# Patient Record
Sex: Female | Born: 1940
Health system: Southern US, Community
[De-identification: ages and names within clinical notes are randomized; demographics above are authoritative.]

## PROBLEM LIST (undated history)

## (undated) DIAGNOSIS — M1990 Unspecified osteoarthritis, unspecified site: Secondary | ICD-10-CM

## (undated) HISTORY — DX: Unspecified osteoarthritis, unspecified site: M19.90

---

## 1975-12-03 HISTORY — PX: TUBAL LIGATION: SHX77

## 1998-12-02 HISTORY — PX: HEMORRHOID SURGERY: SHX153

## 1998-12-02 HISTORY — PX: HERNIA REPAIR: SHX51

## 2002-06-02 ENCOUNTER — Ambulatory Visit (HOSPITAL_COMMUNITY): Admission: RE | Admit: 2002-06-02 | Discharge: 2002-06-02 | Payer: Self-pay | Admitting: Family Medicine

## 2002-06-02 ENCOUNTER — Encounter: Payer: Self-pay | Admitting: Family Medicine

## 2002-06-15 ENCOUNTER — Encounter: Payer: Self-pay | Admitting: Family Medicine

## 2002-06-15 ENCOUNTER — Encounter: Admission: RE | Admit: 2002-06-15 | Discharge: 2002-06-15 | Payer: Self-pay | Admitting: Family Medicine

## 2003-03-18 ENCOUNTER — Encounter: Admission: RE | Admit: 2003-03-18 | Discharge: 2003-03-18 | Payer: Self-pay | Admitting: Internal Medicine

## 2003-03-18 ENCOUNTER — Encounter: Payer: Self-pay | Admitting: Internal Medicine

## 2004-03-26 ENCOUNTER — Encounter: Admission: RE | Admit: 2004-03-26 | Discharge: 2004-03-26 | Payer: Self-pay | Admitting: Internal Medicine

## 2004-04-25 ENCOUNTER — Encounter: Admission: RE | Admit: 2004-04-25 | Discharge: 2004-04-25 | Payer: Self-pay | Admitting: Obstetrics and Gynecology

## 2005-04-12 ENCOUNTER — Ambulatory Visit: Payer: Self-pay | Admitting: Internal Medicine

## 2005-05-02 ENCOUNTER — Ambulatory Visit: Payer: Self-pay | Admitting: Internal Medicine

## 2005-05-27 ENCOUNTER — Encounter: Admission: RE | Admit: 2005-05-27 | Discharge: 2005-05-27 | Payer: Self-pay | Admitting: Obstetrics and Gynecology

## 2006-06-02 ENCOUNTER — Encounter: Admission: RE | Admit: 2006-06-02 | Discharge: 2006-06-02 | Payer: Self-pay | Admitting: Internal Medicine

## 2006-12-02 HISTORY — PX: CARDIAC CATHETERIZATION: SHX172

## 2007-06-15 ENCOUNTER — Encounter: Admission: RE | Admit: 2007-06-15 | Discharge: 2007-06-15 | Payer: Self-pay | Admitting: Obstetrics & Gynecology

## 2010-01-01 ENCOUNTER — Encounter: Admission: RE | Admit: 2010-01-01 | Discharge: 2010-01-01 | Payer: Self-pay | Admitting: Internal Medicine

## 2010-11-12 ENCOUNTER — Encounter
Admission: RE | Admit: 2010-11-12 | Discharge: 2010-11-12 | Payer: Self-pay | Source: Home / Self Care | Attending: Internal Medicine | Admitting: Internal Medicine

## 2011-01-09 ENCOUNTER — Other Ambulatory Visit: Payer: Self-pay | Admitting: Internal Medicine

## 2011-01-09 DIAGNOSIS — Z1231 Encounter for screening mammogram for malignant neoplasm of breast: Secondary | ICD-10-CM

## 2011-01-17 ENCOUNTER — Ambulatory Visit: Payer: Self-pay

## 2011-01-23 ENCOUNTER — Ambulatory Visit
Admission: RE | Admit: 2011-01-23 | Discharge: 2011-01-23 | Disposition: A | Discharge status: Home / Self Care | Payer: BC Managed Care – PPO | Source: Ambulatory Visit | Attending: Internal Medicine | Admitting: Internal Medicine

## 2011-01-23 DIAGNOSIS — Z1231 Encounter for screening mammogram for malignant neoplasm of breast: Secondary | ICD-10-CM

## 2012-01-06 ENCOUNTER — Other Ambulatory Visit: Payer: Self-pay | Admitting: Internal Medicine

## 2012-01-06 DIAGNOSIS — Z1231 Encounter for screening mammogram for malignant neoplasm of breast: Secondary | ICD-10-CM

## 2012-01-28 ENCOUNTER — Ambulatory Visit
Admission: RE | Admit: 2012-01-28 | Discharge: 2012-01-28 | Disposition: A | Discharge status: Home / Self Care | Payer: BC Managed Care – PPO | Source: Ambulatory Visit | Attending: Internal Medicine | Admitting: Internal Medicine

## 2012-01-28 DIAGNOSIS — Z1231 Encounter for screening mammogram for malignant neoplasm of breast: Secondary | ICD-10-CM

## 2012-03-27 ENCOUNTER — Encounter: Payer: Self-pay | Admitting: Internal Medicine

## 2012-09-16 ENCOUNTER — Encounter: Payer: Self-pay | Admitting: Internal Medicine

## 2012-10-22 ENCOUNTER — Ambulatory Visit (AMBULATORY_SURGERY_CENTER): Payer: BC Managed Care – PPO | Admitting: *Deleted

## 2012-10-22 ENCOUNTER — Encounter: Payer: Self-pay | Admitting: Internal Medicine

## 2012-10-22 VITALS — Ht 60.0 in | Wt 148.0 lb

## 2012-10-22 DIAGNOSIS — Z1211 Encounter for screening for malignant neoplasm of colon: Secondary | ICD-10-CM

## 2012-10-22 MED ORDER — MOVIPREP 100 G PO SOLR: ORAL | Status: DC

## 2012-11-12 ENCOUNTER — Encounter: Payer: Self-pay | Admitting: Internal Medicine

## 2012-11-12 ENCOUNTER — Ambulatory Visit (AMBULATORY_SURGERY_CENTER): Payer: BC Managed Care – PPO | Admitting: Internal Medicine

## 2012-11-12 VITALS — BP 135/80 | HR 60 | Temp 95.8°F | Resp 18 | Ht 60.0 in | Wt 148.0 lb

## 2012-11-12 DIAGNOSIS — Z1211 Encounter for screening for malignant neoplasm of colon: Secondary | ICD-10-CM

## 2012-11-12 DIAGNOSIS — K5730 Diverticulosis of large intestine without perforation or abscess without bleeding: Secondary | ICD-10-CM

## 2012-11-12 MED ORDER — SODIUM CHLORIDE 0.9 % IV SOLN: 500.0000 mL | INTRAVENOUS | Status: DC

## 2012-11-12 NOTE — Patient Instructions (Addendum)

## 2012-11-12 NOTE — Op Note (Signed)
[facility] [address] [place], [address]   COLONOSCOPY PROCEDURE REPORT  PATIENT: Ebony Kennedy, Ebony Kennedy  MR#: [id number] BIRTHDATE: 11-19-1941 , 71  yrs. old GENDER: Female ENDOSCOPIST: Roxy Cedar, MD REFERRED XB:[id number] Recall, M.D. PROCEDURE DATE:  11/12/2012 PROCEDURE:   Colonoscopy, screening ASA CLASS:   Class II INDICATIONS:Average risk patient for colon cancer.   Negative exam 2006 MEDICATIONS: MAC sedation, administered by CRNA and propofol (Diprivan) 100mg  IV  DESCRIPTION OF PROCEDURE:   After the risks benefits and alternatives of the procedure were thoroughly explained, informed consent was obtained.  A digital rectal exam revealed no abnormalities of the rectum.   The LB [id number]  endoscope was introduced through the anus and advanced to the cecum, which was identified by both the appendix and ileocecal valve. No adverse events experienced.   The quality of the prep was excellent, using MoviPrep  The instrument was then slowly withdrawn as the colon was fully examined.      COLON FINDINGS: Mild diverticulosis was noted in the sigmoid colon. The colon was otherwise normal.  There was no diverticulosis, inflammation, polyps or cancers unless previously stated. Retroflexed views revealed no abnormalities. The time to cecum=4 minutes 45 seconds.  Withdrawal time=8 minutes 45 seconds.  The scope was withdrawn and the procedure completed. COMPLICATIONS: There were no complications.  ENDOSCOPIC IMPRESSION: 1.   Mild diverticulosis was noted in the sigmoid colon 2.   The colon was otherwise normal  RECOMMENDATIONS: 1. Return to the care of your primary provider.  GI follow up as needed   eSigned:  Roxy Cedar, MD 11/12/2012 10:48 AM   cc: Guerry Bruin, MD and The Patient

## 2012-11-12 NOTE — Progress Notes (Signed)
Propofol given over incremental dosages 

## 2012-11-12 NOTE — Progress Notes (Signed)
Patient did not experience any of the following events: a burn prior to discharge; a fall within the facility; wrong site/side/patient/procedure/implant event; or a hospital transfer or hospital admission upon discharge from the facility. (G8907) Patient did not have preoperative order for IV antibiotic SSI prophylaxis. (G8918)  

## 2012-11-13 ENCOUNTER — Telehealth: Payer: Self-pay | Admitting: *Deleted

## 2012-11-13 NOTE — Telephone Encounter (Signed)
  Follow up Call-  Call back number 11/12/2012  Post procedure Call Back phone  # [phone]  Permission to leave phone message Yes     Patient questions:  Do you have a fever, pain , or abdominal swelling? no Pain Score  0 *  Have you tolerated food without any problems? yes  Have you been able to return to your normal activities? yes  Do you have any questions about your discharge instructions: Diet   no Medications  no Follow up visit  no  Do you have questions or concerns about your Care? no  Actions: * If pain score is 4 or above: No action needed, pain <4.

## 2013-01-25 ENCOUNTER — Other Ambulatory Visit: Payer: Self-pay | Admitting: Internal Medicine

## 2013-02-18 ENCOUNTER — Ambulatory Visit
Admission: RE | Admit: 2013-02-18 | Discharge: 2013-02-18 | Disposition: A | Discharge status: Home / Self Care | Payer: Medicare Other | Source: Ambulatory Visit | Attending: Internal Medicine | Admitting: Internal Medicine

## 2014-04-06 ENCOUNTER — Other Ambulatory Visit: Payer: Self-pay

## 2014-04-06 DIAGNOSIS — Z1231 Encounter for screening mammogram for malignant neoplasm of breast: Secondary | ICD-10-CM

## 2014-04-14 ENCOUNTER — Ambulatory Visit
Admission: RE | Admit: 2014-04-14 | Discharge: 2014-04-14 | Disposition: A | Discharge status: Home / Self Care | Payer: Medicare Other | Source: Ambulatory Visit

## 2014-04-14 ENCOUNTER — Encounter (INDEPENDENT_AMBULATORY_CARE_PROVIDER_SITE_OTHER): Payer: Self-pay

## 2014-04-14 DIAGNOSIS — Z1231 Encounter for screening mammogram for malignant neoplasm of breast: Secondary | ICD-10-CM

## 2016-01-16 DIAGNOSIS — E559 Vitamin D deficiency, unspecified: Secondary | ICD-10-CM | POA: Diagnosis not present

## 2016-01-16 DIAGNOSIS — E7800 Pure hypercholesterolemia, unspecified: Secondary | ICD-10-CM | POA: Diagnosis not present

## 2016-01-16 DIAGNOSIS — R8290 Unspecified abnormal findings in urine: Secondary | ICD-10-CM | POA: Diagnosis not present

## 2016-01-23 DIAGNOSIS — M859 Disorder of bone density and structure, unspecified: Secondary | ICD-10-CM | POA: Diagnosis not present

## 2016-01-23 DIAGNOSIS — E559 Vitamin D deficiency, unspecified: Secondary | ICD-10-CM | POA: Diagnosis not present

## 2016-01-23 DIAGNOSIS — Z1389 Encounter for screening for other disorder: Secondary | ICD-10-CM | POA: Diagnosis not present

## 2016-01-23 DIAGNOSIS — Z6829 Body mass index (BMI) 29.0-29.9, adult: Secondary | ICD-10-CM | POA: Diagnosis not present

## 2016-01-23 DIAGNOSIS — E7800 Pure hypercholesterolemia, unspecified: Secondary | ICD-10-CM | POA: Diagnosis not present

## 2016-01-23 DIAGNOSIS — Z0000 Encounter for general adult medical examination without abnormal findings: Secondary | ICD-10-CM | POA: Diagnosis not present

## 2016-01-23 DIAGNOSIS — N39490 Overflow incontinence: Secondary | ICD-10-CM | POA: Diagnosis not present

## 2016-01-23 DIAGNOSIS — D692 Other nonthrombocytopenic purpura: Secondary | ICD-10-CM | POA: Diagnosis not present

## 2016-01-25 DIAGNOSIS — Z1212 Encounter for screening for malignant neoplasm of rectum: Secondary | ICD-10-CM | POA: Diagnosis not present

## 2016-10-22 DIAGNOSIS — J019 Acute sinusitis, unspecified: Secondary | ICD-10-CM | POA: Diagnosis not present

## 2016-10-22 DIAGNOSIS — Z6828 Body mass index (BMI) 28.0-28.9, adult: Secondary | ICD-10-CM | POA: Diagnosis not present

## 2016-10-22 DIAGNOSIS — R42 Dizziness and giddiness: Secondary | ICD-10-CM | POA: Diagnosis not present

## 2016-11-25 DIAGNOSIS — M545 Low back pain: Secondary | ICD-10-CM | POA: Diagnosis not present

## 2016-11-26 DIAGNOSIS — R05 Cough: Secondary | ICD-10-CM | POA: Diagnosis not present

## 2016-11-26 DIAGNOSIS — R3121 Asymptomatic microscopic hematuria: Secondary | ICD-10-CM | POA: Diagnosis not present

## 2016-11-26 DIAGNOSIS — J0100 Acute maxillary sinusitis, unspecified: Secondary | ICD-10-CM | POA: Diagnosis not present

## 2016-11-26 DIAGNOSIS — Z6827 Body mass index (BMI) 27.0-27.9, adult: Secondary | ICD-10-CM | POA: Diagnosis not present

## 2016-11-27 ENCOUNTER — Other Ambulatory Visit: Payer: Self-pay | Admitting: Internal Medicine

## 2016-11-27 DIAGNOSIS — M545 Low back pain: Secondary | ICD-10-CM

## 2016-11-27 DIAGNOSIS — R3121 Asymptomatic microscopic hematuria: Secondary | ICD-10-CM

## 2016-12-04 ENCOUNTER — Ambulatory Visit
Admission: RE | Admit: 2016-12-04 | Discharge: 2016-12-04 | Disposition: A | Discharge status: Home / Self Care | Payer: PPO | Source: Ambulatory Visit | Attending: Internal Medicine | Admitting: Internal Medicine

## 2016-12-04 DIAGNOSIS — M545 Low back pain: Secondary | ICD-10-CM

## 2016-12-04 DIAGNOSIS — R3121 Asymptomatic microscopic hematuria: Secondary | ICD-10-CM

## 2016-12-04 DIAGNOSIS — R3129 Other microscopic hematuria: Secondary | ICD-10-CM | POA: Diagnosis not present

## 2016-12-12 DIAGNOSIS — J181 Lobar pneumonia, unspecified organism: Secondary | ICD-10-CM | POA: Diagnosis not present

## 2017-01-10 DIAGNOSIS — N3941 Urge incontinence: Secondary | ICD-10-CM | POA: Diagnosis not present

## 2017-01-10 DIAGNOSIS — R351 Nocturia: Secondary | ICD-10-CM | POA: Diagnosis not present

## 2017-01-10 DIAGNOSIS — R350 Frequency of micturition: Secondary | ICD-10-CM | POA: Diagnosis not present

## 2017-01-10 DIAGNOSIS — R3121 Asymptomatic microscopic hematuria: Secondary | ICD-10-CM | POA: Diagnosis not present

## 2017-01-23 DIAGNOSIS — M859 Disorder of bone density and structure, unspecified: Secondary | ICD-10-CM | POA: Diagnosis not present

## 2017-01-23 DIAGNOSIS — E559 Vitamin D deficiency, unspecified: Secondary | ICD-10-CM | POA: Diagnosis not present

## 2017-01-23 DIAGNOSIS — Z0000 Encounter for general adult medical examination without abnormal findings: Secondary | ICD-10-CM | POA: Diagnosis not present

## 2017-01-23 DIAGNOSIS — E7800 Pure hypercholesterolemia, unspecified: Secondary | ICD-10-CM | POA: Diagnosis not present

## 2017-01-30 DIAGNOSIS — M859 Disorder of bone density and structure, unspecified: Secondary | ICD-10-CM | POA: Diagnosis not present

## 2017-01-30 DIAGNOSIS — N39490 Overflow incontinence: Secondary | ICD-10-CM | POA: Diagnosis not present

## 2017-01-30 DIAGNOSIS — E7800 Pure hypercholesterolemia, unspecified: Secondary | ICD-10-CM | POA: Diagnosis not present

## 2017-01-30 DIAGNOSIS — Z1389 Encounter for screening for other disorder: Secondary | ICD-10-CM | POA: Diagnosis not present

## 2017-01-30 DIAGNOSIS — Z6828 Body mass index (BMI) 28.0-28.9, adult: Secondary | ICD-10-CM | POA: Diagnosis not present

## 2017-01-30 DIAGNOSIS — D692 Other nonthrombocytopenic purpura: Secondary | ICD-10-CM | POA: Diagnosis not present

## 2017-01-30 DIAGNOSIS — Z0000 Encounter for general adult medical examination without abnormal findings: Secondary | ICD-10-CM | POA: Diagnosis not present

## 2017-01-30 DIAGNOSIS — M810 Age-related osteoporosis without current pathological fracture: Secondary | ICD-10-CM | POA: Diagnosis not present

## 2017-01-30 DIAGNOSIS — R3121 Asymptomatic microscopic hematuria: Secondary | ICD-10-CM | POA: Diagnosis not present

## 2017-01-30 DIAGNOSIS — E559 Vitamin D deficiency, unspecified: Secondary | ICD-10-CM | POA: Diagnosis not present

## 2017-02-04 DIAGNOSIS — K7689 Other specified diseases of liver: Secondary | ICD-10-CM | POA: Diagnosis not present

## 2017-02-04 DIAGNOSIS — D259 Leiomyoma of uterus, unspecified: Secondary | ICD-10-CM | POA: Diagnosis not present

## 2017-02-04 DIAGNOSIS — N289 Disorder of kidney and ureter, unspecified: Secondary | ICD-10-CM | POA: Diagnosis not present

## 2017-02-04 DIAGNOSIS — R3121 Asymptomatic microscopic hematuria: Secondary | ICD-10-CM | POA: Diagnosis not present

## 2017-02-14 DIAGNOSIS — Z1212 Encounter for screening for malignant neoplasm of rectum: Secondary | ICD-10-CM | POA: Diagnosis not present

## 2017-02-19 ENCOUNTER — Other Ambulatory Visit: Payer: Self-pay | Admitting: Urology

## 2017-02-19 DIAGNOSIS — N3941 Urge incontinence: Secondary | ICD-10-CM | POA: Diagnosis not present

## 2017-02-19 DIAGNOSIS — R3121 Asymptomatic microscopic hematuria: Secondary | ICD-10-CM | POA: Diagnosis not present

## 2017-02-19 DIAGNOSIS — N281 Cyst of kidney, acquired: Secondary | ICD-10-CM

## 2017-03-13 ENCOUNTER — Ambulatory Visit (HOSPITAL_COMMUNITY)
Admission: RE | Admit: 2017-03-13 | Discharge: 2017-03-13 | Disposition: A | Discharge status: Home / Self Care | Payer: PPO | Source: Ambulatory Visit | Attending: Urology | Admitting: Urology

## 2017-03-13 DIAGNOSIS — N281 Cyst of kidney, acquired: Secondary | ICD-10-CM | POA: Insufficient documentation

## 2017-03-13 DIAGNOSIS — R319 Hematuria, unspecified: Secondary | ICD-10-CM | POA: Diagnosis not present

## 2017-03-13 DIAGNOSIS — K7689 Other specified diseases of liver: Secondary | ICD-10-CM | POA: Insufficient documentation

## 2017-03-13 LAB — POCT I-STAT CREATININE: Creatinine, Ser: 1 mg/dL (ref 0.44–1.00)

## 2017-03-13 MED ORDER — GADOBENATE DIMEGLUMINE 529 MG/ML IV SOLN
15.0000 mL | Freq: Once | INTRAVENOUS | Status: AC | PRN
  Administered 2017-03-13: 13 mL via INTRAVENOUS

## 2017-09-23 ENCOUNTER — Other Ambulatory Visit: Payer: Self-pay | Admitting: Internal Medicine

## 2017-09-23 DIAGNOSIS — Z1231 Encounter for screening mammogram for malignant neoplasm of breast: Secondary | ICD-10-CM

## 2017-09-23 DIAGNOSIS — R000 Tachycardia, unspecified: Secondary | ICD-10-CM | POA: Diagnosis not present

## 2017-09-23 DIAGNOSIS — Z23 Encounter for immunization: Secondary | ICD-10-CM | POA: Diagnosis not present

## 2017-09-23 DIAGNOSIS — R0602 Shortness of breath: Secondary | ICD-10-CM | POA: Diagnosis not present

## 2017-09-23 DIAGNOSIS — Z6828 Body mass index (BMI) 28.0-28.9, adult: Secondary | ICD-10-CM | POA: Diagnosis not present

## 2017-09-26 ENCOUNTER — Other Ambulatory Visit: Payer: Self-pay | Admitting: Internal Medicine

## 2017-09-26 DIAGNOSIS — R0602 Shortness of breath: Secondary | ICD-10-CM

## 2017-09-26 DIAGNOSIS — R000 Tachycardia, unspecified: Secondary | ICD-10-CM

## 2017-10-07 ENCOUNTER — Ambulatory Visit (INDEPENDENT_AMBULATORY_CARE_PROVIDER_SITE_OTHER): Payer: PPO

## 2017-10-07 DIAGNOSIS — R0602 Shortness of breath: Secondary | ICD-10-CM

## 2017-10-07 DIAGNOSIS — R000 Tachycardia, unspecified: Secondary | ICD-10-CM | POA: Diagnosis not present

## 2017-10-14 ENCOUNTER — Ambulatory Visit
Admission: RE | Admit: 2017-10-14 | Discharge: 2017-10-14 | Disposition: A | Discharge status: Home / Self Care | Payer: PPO | Source: Ambulatory Visit | Attending: Internal Medicine | Admitting: Internal Medicine

## 2017-10-14 DIAGNOSIS — Z1231 Encounter for screening mammogram for malignant neoplasm of breast: Secondary | ICD-10-CM

## 2018-01-28 DIAGNOSIS — E559 Vitamin D deficiency, unspecified: Secondary | ICD-10-CM | POA: Diagnosis not present

## 2018-01-28 DIAGNOSIS — E7800 Pure hypercholesterolemia, unspecified: Secondary | ICD-10-CM | POA: Diagnosis not present

## 2018-01-28 DIAGNOSIS — R82998 Other abnormal findings in urine: Secondary | ICD-10-CM | POA: Diagnosis not present

## 2018-02-04 DIAGNOSIS — D692 Other nonthrombocytopenic purpura: Secondary | ICD-10-CM | POA: Diagnosis not present

## 2018-02-04 DIAGNOSIS — E7800 Pure hypercholesterolemia, unspecified: Secondary | ICD-10-CM | POA: Diagnosis not present

## 2018-02-04 DIAGNOSIS — Z6828 Body mass index (BMI) 28.0-28.9, adult: Secondary | ICD-10-CM | POA: Diagnosis not present

## 2018-02-04 DIAGNOSIS — E559 Vitamin D deficiency, unspecified: Secondary | ICD-10-CM | POA: Diagnosis not present

## 2018-02-04 DIAGNOSIS — R3121 Asymptomatic microscopic hematuria: Secondary | ICD-10-CM | POA: Diagnosis not present

## 2018-02-04 DIAGNOSIS — M810 Age-related osteoporosis without current pathological fracture: Secondary | ICD-10-CM | POA: Diagnosis not present

## 2018-02-04 DIAGNOSIS — Z1389 Encounter for screening for other disorder: Secondary | ICD-10-CM | POA: Diagnosis not present

## 2018-02-04 DIAGNOSIS — N39490 Overflow incontinence: Secondary | ICD-10-CM | POA: Diagnosis not present

## 2018-02-04 DIAGNOSIS — Z23 Encounter for immunization: Secondary | ICD-10-CM | POA: Diagnosis not present

## 2018-02-04 DIAGNOSIS — Z0000 Encounter for general adult medical examination without abnormal findings: Secondary | ICD-10-CM | POA: Diagnosis not present

## 2018-02-06 DIAGNOSIS — Z1212 Encounter for screening for malignant neoplasm of rectum: Secondary | ICD-10-CM | POA: Diagnosis not present

## 2018-02-27 DIAGNOSIS — H25013 Cortical age-related cataract, bilateral: Secondary | ICD-10-CM | POA: Diagnosis not present

## 2018-02-27 DIAGNOSIS — H2513 Age-related nuclear cataract, bilateral: Secondary | ICD-10-CM | POA: Diagnosis not present

## 2018-02-27 DIAGNOSIS — H35033 Hypertensive retinopathy, bilateral: Secondary | ICD-10-CM | POA: Diagnosis not present

## 2018-02-27 DIAGNOSIS — I7090 Unspecified atherosclerosis: Secondary | ICD-10-CM | POA: Diagnosis not present

## 2018-09-14 ENCOUNTER — Other Ambulatory Visit: Payer: Self-pay | Admitting: Internal Medicine

## 2018-09-14 DIAGNOSIS — Z1231 Encounter for screening mammogram for malignant neoplasm of breast: Secondary | ICD-10-CM

## 2018-10-21 ENCOUNTER — Ambulatory Visit
Admission: RE | Admit: 2018-10-21 | Discharge: 2018-10-21 | Disposition: A | Discharge status: Home / Self Care | Payer: PPO | Source: Ambulatory Visit | Attending: Internal Medicine | Admitting: Internal Medicine

## 2018-10-21 DIAGNOSIS — Z1231 Encounter for screening mammogram for malignant neoplasm of breast: Secondary | ICD-10-CM

## 2018-11-04 DIAGNOSIS — Z6828 Body mass index (BMI) 28.0-28.9, adult: Secondary | ICD-10-CM | POA: Diagnosis not present

## 2018-11-04 DIAGNOSIS — J209 Acute bronchitis, unspecified: Secondary | ICD-10-CM | POA: Diagnosis not present

## 2018-11-04 DIAGNOSIS — R05 Cough: Secondary | ICD-10-CM | POA: Diagnosis not present

## 2019-01-18 ENCOUNTER — Encounter (HOSPITAL_COMMUNITY): Payer: Self-pay | Admitting: Emergency Medicine

## 2019-01-18 ENCOUNTER — Ambulatory Visit (INDEPENDENT_AMBULATORY_CARE_PROVIDER_SITE_OTHER): Payer: PPO

## 2019-01-18 ENCOUNTER — Ambulatory Visit (HOSPITAL_COMMUNITY)
Admission: EM | Admit: 2019-01-18 | Discharge: 2019-01-18 | Disposition: A | Discharge status: Home / Self Care | Payer: PPO | Attending: Family Medicine | Admitting: Family Medicine

## 2019-01-18 DIAGNOSIS — J0110 Acute frontal sinusitis, unspecified: Secondary | ICD-10-CM | POA: Diagnosis not present

## 2019-01-18 DIAGNOSIS — R05 Cough: Secondary | ICD-10-CM

## 2019-01-18 MED ORDER — AMOXICILLIN-POT CLAVULANATE 875-125 MG PO TABS: 1.0000 | ORAL_TABLET | Freq: Two times a day (BID) | ORAL | 0 refills | Status: AC

## 2019-01-18 MED ORDER — FLUTICASONE PROPIONATE 50 MCG/ACT NA SUSP: 1.0000 | Freq: Every day | NASAL | 2 refills | Status: DC

## 2019-01-18 NOTE — ED Triage Notes (Signed)
Pt here for URI sx and body aches starting last night

## 2019-01-18 NOTE — ED Provider Notes (Signed)
[place]    CSN: [id number] Arrival date & time: 01/18/19  1007     History   Chief Complaint Chief Complaint  Patient presents with  . URI    HPI Ebony Kennedy is a 78 y.o. female.   Ebony Kennedy presents with complaints of approximately 10 days of productive cough, runny nose, sneezing. Shortness of breath related to congestion, primarily at night. Feels light headed at times. No fevers. No chest pain . No history of COPD or asthma, doesn't smoke. Denies gi/gu complaints. Has been taking multiple different OTC medications for symptoms which haven't helped. No known ill contacts. Denies cardiac history. No ear pain, no sore throat. Takes vitamin d.     ROS per HPI.      Past Medical History:  Diagnosis Date  . Angina pectoris   . Arthritis     There are no active problems to display for this patient.   Past Surgical History:  Procedure Laterality Date  . CARDIAC CATHETERIZATION  2008   normal  . HEMORRHOID SURGERY  2000  . HERNIA REPAIR  2000   Inquinal Bilateral  . TUBAL LIGATION  1977    OB History   No obstetric history on file.      Home Medications    Prior to Admission medications   Medication Sig Start Date End Date Taking? Authorizing Provider  amoxicillin-clavulanate (AUGMENTIN) 875-125 MG tablet Take 1 tablet by mouth every 12 (twelve) hours for 10 days. 01/18/19 01/28/19  Zigmund Gottron, NP  Cholecalciferol (VITAMIN D) 2000 UNITS tablet Take 2,000 Units by mouth daily.    [provider]  fluticasone (FLONASE) 50 MCG/ACT nasal spray Place 1 spray into both nostrils daily. 01/18/19   Zigmund Gottron, NP    Family History Family History  Problem Relation Age of Onset  . Breast cancer Neg Hx     Social History Social History   Tobacco Use  . Smoking status: Never Smoker  . Smokeless tobacco: Never Used  Substance Use Topics  . Alcohol use: Yes    Comment: Less than 2 a month  . Drug use: No     Allergies     Patient has no known allergies.   Review of Systems Review of Systems   Physical Exam Triage Vital Signs ED Triage Vitals [01/18/19 1134]  Enc Vitals Group     BP (!) 139/91     Pulse Rate 75     Resp 18     Temp 97.9 F (36.6 C)     Temp Source Temporal     SpO2 100 %     Weight      Height      Head Circumference      Peak Flow      Pain Score 6     Pain Loc      Pain Edu?      Excl. in [place]?    No data found.  Updated Vital Signs BP (!) 139/91 (BP Location: Right Arm)   Pulse 75   Temp 97.9 F (36.6 C) (Temporal)   Resp 18   SpO2 100%    Physical Exam Constitutional:      General: She is not in acute distress.    Appearance: She is well-developed.  HENT:     Head: Normocephalic and atraumatic.     Right Ear: Tympanic membrane, ear canal and external ear normal.     Left Ear: Tympanic membrane, ear canal and  external ear normal.     Nose: Rhinorrhea present.     Mouth/Throat:     Pharynx: Uvula midline.     Tonsils: No tonsillar exudate.  Eyes:     Conjunctiva/sclera: Conjunctivae normal.     Pupils: Pupils are equal, round, and reactive to light.  Cardiovascular:     Rate and Rhythm: Normal rate and regular rhythm.     Heart sounds: Normal heart sounds.  Pulmonary:     Effort: Pulmonary effort is normal.     Breath sounds: Normal breath sounds.  Skin:    General: Skin is warm and dry.  Neurological:     Mental Status: She is alert and oriented to person, place, and time.      UC Treatments / Results  Labs (all labs ordered are listed, but only abnormal results are displayed) Labs Reviewed - No data to display  EKG None  Radiology Dg Chest 2 View  Result Date: 01/18/2019 CLINICAL DATA:  Productive cough. EXAM: CHEST - 2 VIEW COMPARISON:  None. FINDINGS: The heart size and mediastinal contours are within normal limits. Both lungs are clear. The visualized skeletal structures are unremarkable. IMPRESSION: No active cardiopulmonary disease.  Electronically Signed   By: Marijo Conception, M.D.   On: 01/18/2019 12:50    Procedures Procedures (including critical care time)  Medications Ordered in UC Medications - No data to display  Initial Impression / Assessment and Plan / UC Course  I have reviewed the triage vital signs and the nursing notes.  Pertinent labs & imaging results that were available during my care of the patient were reviewed by me and considered in my medical decision making (see chart for details).     Non toxic,vitals stable. Chest xray normal today. Appears consistent with sinusitis with antibiotics provided. Return precautions provided. If symptoms worsen or do not improve in the next week to return to be seen or to follow up with PCP.  Patient verbalized understanding and agreeable to plan.   Final Clinical Impressions(s) / UC Diagnoses   Final diagnoses:  Acute frontal sinusitis, recurrence not specified     Discharge Instructions     Chest xray looks well today.  Push fluids to ensure adequate hydration and keep secretions thin.  Tylenol and/or ibuprofen as needed for pain or fevers.  Complete course of antibiotics.  Daily nasal spray to help with drainage and post nasal drip.  If symptoms worsen or do not improve in the next week to return to be seen or to follow up with your PCP.      ED Prescriptions    Medication Sig Dispense Auth. Provider   amoxicillin-clavulanate (AUGMENTIN) 875-125 MG tablet Take 1 tablet by mouth every 12 (twelve) hours for 10 days. 20 tablet Augusto Gamble B, NP   fluticasone (FLONASE) 50 MCG/ACT nasal spray Place 1 spray into both nostrils daily. 16 g Zigmund Gottron, NP     Controlled Substance Prescriptions [place] Controlled Substance Registry consulted? Not Applicable   Zigmund Gottron, NP 01/19/19 1433

## 2019-01-18 NOTE — Discharge Instructions (Signed)
Chest xray looks well today.  Push fluids to ensure adequate hydration and keep secretions thin.  Tylenol and/or ibuprofen as needed for pain or fevers.  Complete course of antibiotics.  Daily nasal spray to help with drainage and post nasal drip.  If symptoms worsen or do not improve in the next week to return to be seen or to follow up with your PCP.

## 2019-02-03 DIAGNOSIS — E7800 Pure hypercholesterolemia, unspecified: Secondary | ICD-10-CM | POA: Diagnosis not present

## 2019-02-03 DIAGNOSIS — M810 Age-related osteoporosis without current pathological fracture: Secondary | ICD-10-CM | POA: Diagnosis not present

## 2019-02-03 DIAGNOSIS — E559 Vitamin D deficiency, unspecified: Secondary | ICD-10-CM | POA: Diagnosis not present

## 2019-02-03 DIAGNOSIS — R82998 Other abnormal findings in urine: Secondary | ICD-10-CM | POA: Diagnosis not present

## 2019-02-10 DIAGNOSIS — D692 Other nonthrombocytopenic purpura: Secondary | ICD-10-CM | POA: Diagnosis not present

## 2019-02-10 DIAGNOSIS — E7800 Pure hypercholesterolemia, unspecified: Secondary | ICD-10-CM | POA: Diagnosis not present

## 2019-02-10 DIAGNOSIS — M810 Age-related osteoporosis without current pathological fracture: Secondary | ICD-10-CM | POA: Diagnosis not present

## 2019-02-10 DIAGNOSIS — Z6828 Body mass index (BMI) 28.0-28.9, adult: Secondary | ICD-10-CM | POA: Diagnosis not present

## 2019-02-10 DIAGNOSIS — E559 Vitamin D deficiency, unspecified: Secondary | ICD-10-CM | POA: Diagnosis not present

## 2019-02-10 DIAGNOSIS — Z1331 Encounter for screening for depression: Secondary | ICD-10-CM | POA: Diagnosis not present

## 2019-02-10 DIAGNOSIS — R3121 Asymptomatic microscopic hematuria: Secondary | ICD-10-CM | POA: Diagnosis not present

## 2019-02-10 DIAGNOSIS — Z0000 Encounter for general adult medical examination without abnormal findings: Secondary | ICD-10-CM | POA: Diagnosis not present

## 2019-02-10 DIAGNOSIS — N39490 Overflow incontinence: Secondary | ICD-10-CM | POA: Diagnosis not present

## 2019-03-05 DIAGNOSIS — H01009 Unspecified blepharitis unspecified eye, unspecified eyelid: Secondary | ICD-10-CM | POA: Diagnosis not present

## 2019-03-05 DIAGNOSIS — H40033 Anatomical narrow angle, bilateral: Secondary | ICD-10-CM | POA: Diagnosis not present

## 2019-03-05 DIAGNOSIS — H2513 Age-related nuclear cataract, bilateral: Secondary | ICD-10-CM | POA: Diagnosis not present

## 2019-03-05 DIAGNOSIS — H25013 Cortical age-related cataract, bilateral: Secondary | ICD-10-CM | POA: Diagnosis not present

## 2019-03-17 DIAGNOSIS — H40033 Anatomical narrow angle, bilateral: Secondary | ICD-10-CM | POA: Diagnosis not present

## 2019-03-17 DIAGNOSIS — H169 Unspecified keratitis: Secondary | ICD-10-CM | POA: Diagnosis not present

## 2019-03-17 DIAGNOSIS — H16223 Keratoconjunctivitis sicca, not specified as Sjogren's, bilateral: Secondary | ICD-10-CM | POA: Diagnosis not present

## 2019-03-17 DIAGNOSIS — H04123 Dry eye syndrome of bilateral lacrimal glands: Secondary | ICD-10-CM | POA: Diagnosis not present

## 2019-03-31 DIAGNOSIS — H40033 Anatomical narrow angle, bilateral: Secondary | ICD-10-CM | POA: Diagnosis not present

## 2019-03-31 DIAGNOSIS — H40031 Anatomical narrow angle, right eye: Secondary | ICD-10-CM | POA: Diagnosis not present

## 2019-03-31 DIAGNOSIS — H04123 Dry eye syndrome of bilateral lacrimal glands: Secondary | ICD-10-CM | POA: Diagnosis not present

## 2019-03-31 DIAGNOSIS — H169 Unspecified keratitis: Secondary | ICD-10-CM | POA: Diagnosis not present

## 2019-03-31 DIAGNOSIS — D3142 Benign neoplasm of left ciliary body: Secondary | ICD-10-CM | POA: Diagnosis not present

## 2019-04-14 DIAGNOSIS — Z1212 Encounter for screening for malignant neoplasm of rectum: Secondary | ICD-10-CM | POA: Diagnosis not present

## 2019-04-21 DIAGNOSIS — H40032 Anatomical narrow angle, left eye: Secondary | ICD-10-CM | POA: Diagnosis not present

## 2019-06-03 ENCOUNTER — Other Ambulatory Visit: Payer: Self-pay | Admitting: Internal Medicine

## 2019-06-03 DIAGNOSIS — R519 Headache, unspecified: Secondary | ICD-10-CM

## 2019-06-03 DIAGNOSIS — R4020 Unspecified coma: Secondary | ICD-10-CM | POA: Diagnosis not present

## 2019-06-03 DIAGNOSIS — R42 Dizziness and giddiness: Secondary | ICD-10-CM | POA: Diagnosis not present

## 2019-06-03 DIAGNOSIS — R110 Nausea: Secondary | ICD-10-CM

## 2019-06-03 DIAGNOSIS — R51 Headache: Secondary | ICD-10-CM | POA: Diagnosis not present

## 2019-06-07 ENCOUNTER — Ambulatory Visit
Admission: RE | Admit: 2019-06-07 | Discharge: 2019-06-07 | Disposition: A | Discharge status: Home / Self Care | Payer: PPO | Source: Ambulatory Visit | Attending: Internal Medicine | Admitting: Internal Medicine

## 2019-06-07 DIAGNOSIS — R110 Nausea: Secondary | ICD-10-CM

## 2019-06-07 DIAGNOSIS — R519 Headache, unspecified: Secondary | ICD-10-CM

## 2019-06-07 DIAGNOSIS — I6782 Cerebral ischemia: Secondary | ICD-10-CM | POA: Diagnosis not present

## 2019-06-11 DIAGNOSIS — H04123 Dry eye syndrome of bilateral lacrimal glands: Secondary | ICD-10-CM | POA: Diagnosis not present

## 2019-06-11 DIAGNOSIS — H40033 Anatomical narrow angle, bilateral: Secondary | ICD-10-CM | POA: Diagnosis not present

## 2019-06-11 DIAGNOSIS — H16223 Keratoconjunctivitis sicca, not specified as Sjogren's, bilateral: Secondary | ICD-10-CM | POA: Diagnosis not present

## 2019-06-11 DIAGNOSIS — D3142 Benign neoplasm of left ciliary body: Secondary | ICD-10-CM | POA: Diagnosis not present

## 2019-06-28 DIAGNOSIS — H40033 Anatomical narrow angle, bilateral: Secondary | ICD-10-CM | POA: Diagnosis not present

## 2019-06-28 DIAGNOSIS — H04123 Dry eye syndrome of bilateral lacrimal glands: Secondary | ICD-10-CM | POA: Diagnosis not present

## 2019-06-28 DIAGNOSIS — H10013 Acute follicular conjunctivitis, bilateral: Secondary | ICD-10-CM | POA: Diagnosis not present

## 2019-06-28 DIAGNOSIS — D3142 Benign neoplasm of left ciliary body: Secondary | ICD-10-CM | POA: Diagnosis not present

## 2019-07-19 DIAGNOSIS — H402221 Chronic angle-closure glaucoma, left eye, mild stage: Secondary | ICD-10-CM | POA: Diagnosis not present

## 2019-07-19 DIAGNOSIS — H04123 Dry eye syndrome of bilateral lacrimal glands: Secondary | ICD-10-CM | POA: Diagnosis not present

## 2019-07-19 DIAGNOSIS — H402212 Chronic angle-closure glaucoma, right eye, moderate stage: Secondary | ICD-10-CM | POA: Diagnosis not present

## 2019-07-19 DIAGNOSIS — H40033 Anatomical narrow angle, bilateral: Secondary | ICD-10-CM | POA: Diagnosis not present

## 2019-08-02 DIAGNOSIS — H04123 Dry eye syndrome of bilateral lacrimal glands: Secondary | ICD-10-CM | POA: Diagnosis not present

## 2019-08-02 DIAGNOSIS — H402221 Chronic angle-closure glaucoma, left eye, mild stage: Secondary | ICD-10-CM | POA: Diagnosis not present

## 2019-08-02 DIAGNOSIS — H40033 Anatomical narrow angle, bilateral: Secondary | ICD-10-CM | POA: Diagnosis not present

## 2019-08-02 DIAGNOSIS — H402212 Chronic angle-closure glaucoma, right eye, moderate stage: Secondary | ICD-10-CM | POA: Diagnosis not present

## 2019-11-08 DIAGNOSIS — H402221 Chronic angle-closure glaucoma, left eye, mild stage: Secondary | ICD-10-CM | POA: Diagnosis not present

## 2019-11-08 DIAGNOSIS — H402212 Chronic angle-closure glaucoma, right eye, moderate stage: Secondary | ICD-10-CM | POA: Diagnosis not present

## 2019-11-08 DIAGNOSIS — H04123 Dry eye syndrome of bilateral lacrimal glands: Secondary | ICD-10-CM | POA: Diagnosis not present

## 2019-11-08 DIAGNOSIS — H40033 Anatomical narrow angle, bilateral: Secondary | ICD-10-CM | POA: Diagnosis not present

## 2019-11-10 ENCOUNTER — Other Ambulatory Visit: Payer: Self-pay | Admitting: Internal Medicine

## 2019-11-10 DIAGNOSIS — Z1231 Encounter for screening mammogram for malignant neoplasm of breast: Secondary | ICD-10-CM

## 2019-12-30 ENCOUNTER — Other Ambulatory Visit: Payer: Self-pay

## 2019-12-30 ENCOUNTER — Ambulatory Visit
Admission: RE | Admit: 2019-12-30 | Discharge: 2019-12-30 | Disposition: A | Discharge status: Home / Self Care | Payer: PPO | Source: Ambulatory Visit | Attending: Internal Medicine | Admitting: Internal Medicine

## 2019-12-30 DIAGNOSIS — Z1231 Encounter for screening mammogram for malignant neoplasm of breast: Secondary | ICD-10-CM | POA: Diagnosis not present

## 2020-01-10 DIAGNOSIS — Z20818 Contact with and (suspected) exposure to other bacterial communicable diseases: Secondary | ICD-10-CM | POA: Diagnosis not present

## 2020-01-10 DIAGNOSIS — Z1152 Encounter for screening for COVID-19: Secondary | ICD-10-CM | POA: Diagnosis not present

## 2020-01-10 DIAGNOSIS — R32 Unspecified urinary incontinence: Secondary | ICD-10-CM | POA: Diagnosis not present

## 2020-01-10 DIAGNOSIS — R5383 Other fatigue: Secondary | ICD-10-CM | POA: Diagnosis not present

## 2020-01-10 DIAGNOSIS — R05 Cough: Secondary | ICD-10-CM | POA: Diagnosis not present

## 2020-01-10 DIAGNOSIS — R197 Diarrhea, unspecified: Secondary | ICD-10-CM | POA: Diagnosis not present

## 2020-01-26 DIAGNOSIS — H2512 Age-related nuclear cataract, left eye: Secondary | ICD-10-CM | POA: Diagnosis not present

## 2020-01-26 DIAGNOSIS — H25013 Cortical age-related cataract, bilateral: Secondary | ICD-10-CM | POA: Diagnosis not present

## 2020-01-26 DIAGNOSIS — H402221 Chronic angle-closure glaucoma, left eye, mild stage: Secondary | ICD-10-CM | POA: Diagnosis not present

## 2020-01-26 DIAGNOSIS — H2513 Age-related nuclear cataract, bilateral: Secondary | ICD-10-CM | POA: Diagnosis not present

## 2020-01-26 DIAGNOSIS — H402212 Chronic angle-closure glaucoma, right eye, moderate stage: Secondary | ICD-10-CM | POA: Diagnosis not present

## 2020-02-11 DIAGNOSIS — N952 Postmenopausal atrophic vaginitis: Secondary | ICD-10-CM | POA: Diagnosis not present

## 2020-02-11 DIAGNOSIS — M810 Age-related osteoporosis without current pathological fracture: Secondary | ICD-10-CM | POA: Diagnosis not present

## 2020-02-11 DIAGNOSIS — N3281 Overactive bladder: Secondary | ICD-10-CM | POA: Diagnosis not present

## 2020-02-11 DIAGNOSIS — E7800 Pure hypercholesterolemia, unspecified: Secondary | ICD-10-CM | POA: Diagnosis not present

## 2020-02-18 DIAGNOSIS — N952 Postmenopausal atrophic vaginitis: Secondary | ICD-10-CM | POA: Diagnosis not present

## 2020-02-18 DIAGNOSIS — D692 Other nonthrombocytopenic purpura: Secondary | ICD-10-CM | POA: Diagnosis not present

## 2020-02-18 DIAGNOSIS — Z0000 Encounter for general adult medical examination without abnormal findings: Secondary | ICD-10-CM | POA: Diagnosis not present

## 2020-02-18 DIAGNOSIS — E559 Vitamin D deficiency, unspecified: Secondary | ICD-10-CM | POA: Diagnosis not present

## 2020-02-18 DIAGNOSIS — H4020X0 Unspecified primary angle-closure glaucoma, stage unspecified: Secondary | ICD-10-CM | POA: Diagnosis not present

## 2020-02-18 DIAGNOSIS — N39490 Overflow incontinence: Secondary | ICD-10-CM | POA: Diagnosis not present

## 2020-02-18 DIAGNOSIS — E663 Overweight: Secondary | ICD-10-CM | POA: Diagnosis not present

## 2020-02-18 DIAGNOSIS — R82998 Other abnormal findings in urine: Secondary | ICD-10-CM | POA: Diagnosis not present

## 2020-02-18 DIAGNOSIS — Z1331 Encounter for screening for depression: Secondary | ICD-10-CM | POA: Diagnosis not present

## 2020-02-18 DIAGNOSIS — E7800 Pure hypercholesterolemia, unspecified: Secondary | ICD-10-CM | POA: Diagnosis not present

## 2020-02-18 DIAGNOSIS — M810 Age-related osteoporosis without current pathological fracture: Secondary | ICD-10-CM | POA: Diagnosis not present

## 2020-02-22 DIAGNOSIS — H25812 Combined forms of age-related cataract, left eye: Secondary | ICD-10-CM | POA: Diagnosis not present

## 2020-02-22 DIAGNOSIS — H2512 Age-related nuclear cataract, left eye: Secondary | ICD-10-CM | POA: Diagnosis not present

## 2020-03-13 DIAGNOSIS — H25011 Cortical age-related cataract, right eye: Secondary | ICD-10-CM | POA: Diagnosis not present

## 2020-03-13 DIAGNOSIS — H2511 Age-related nuclear cataract, right eye: Secondary | ICD-10-CM | POA: Diagnosis not present

## 2020-03-21 DIAGNOSIS — H25811 Combined forms of age-related cataract, right eye: Secondary | ICD-10-CM | POA: Diagnosis not present

## 2020-03-21 DIAGNOSIS — H2511 Age-related nuclear cataract, right eye: Secondary | ICD-10-CM | POA: Diagnosis not present

## 2020-03-21 DIAGNOSIS — H25011 Cortical age-related cataract, right eye: Secondary | ICD-10-CM | POA: Diagnosis not present

## 2020-04-01 IMAGING — MG DIGITAL SCREENING BILATERAL MAMMOGRAM WITH TOMO AND CAD
6 of 10 series · 6 of 30 positions shown · non-contrast
Comparison: Previous exam(s).

CLINICAL DATA: Screening.

EXAM:
DIGITAL SCREENING BILATERAL MAMMOGRAM WITH TOMO AND CAD

[L MLO synth-2D (1 of 2)]
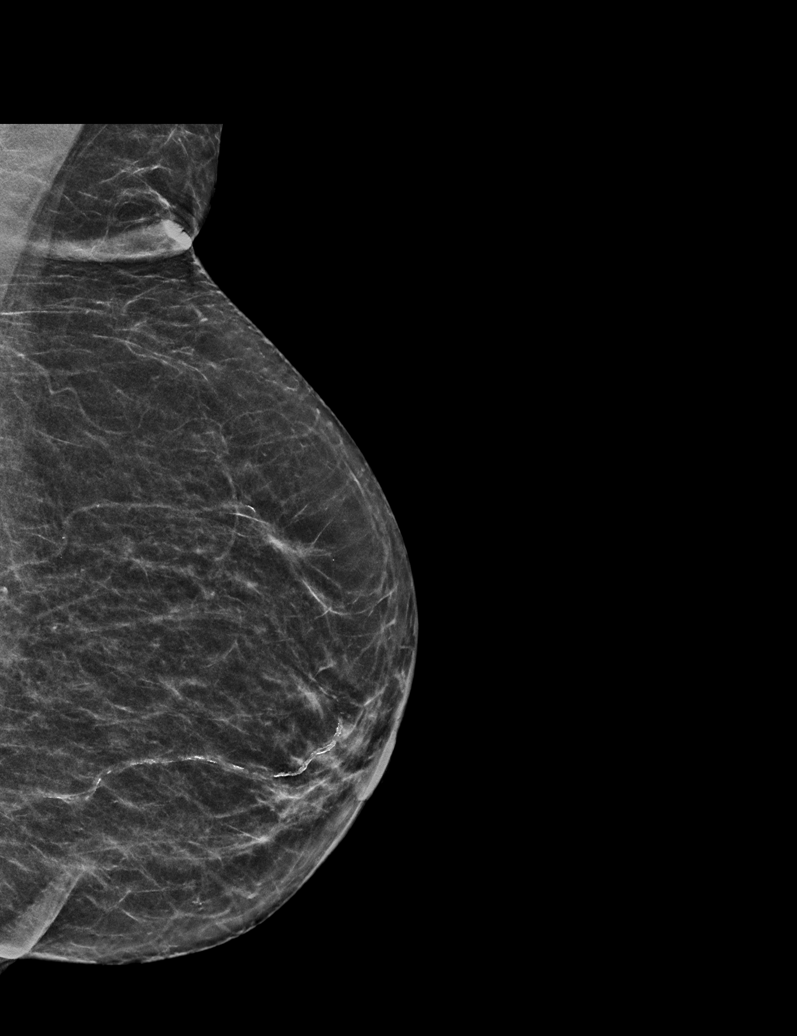

[L CC synth-2D]
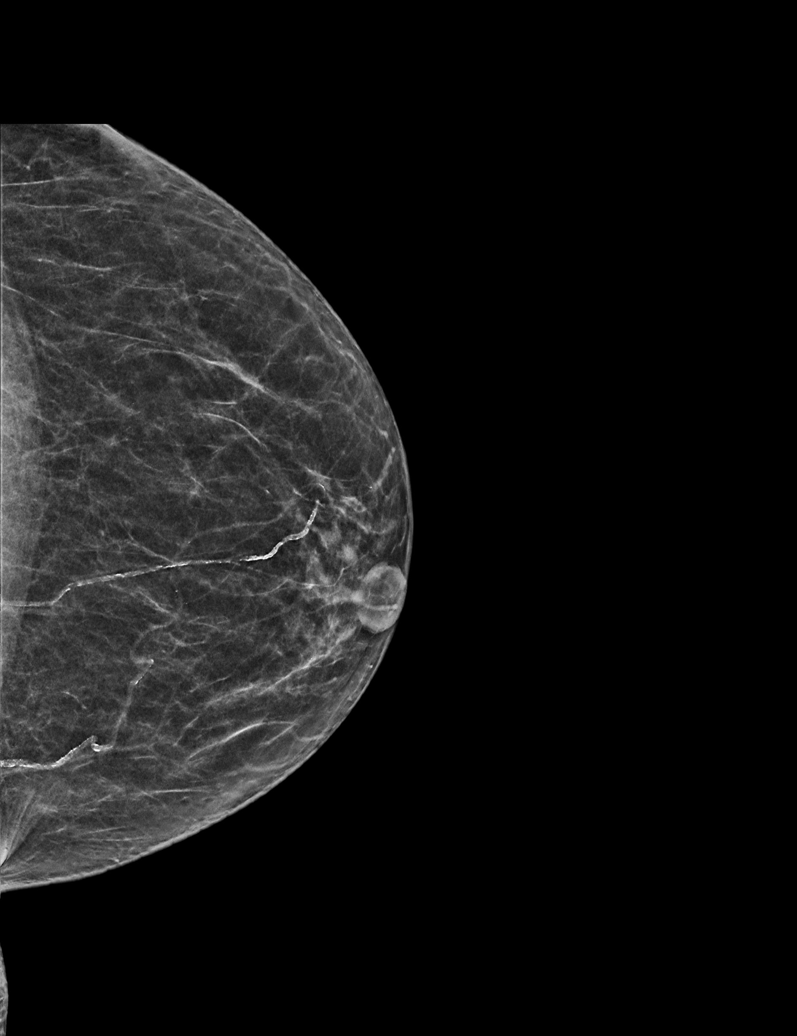

[R CC synth-2D]
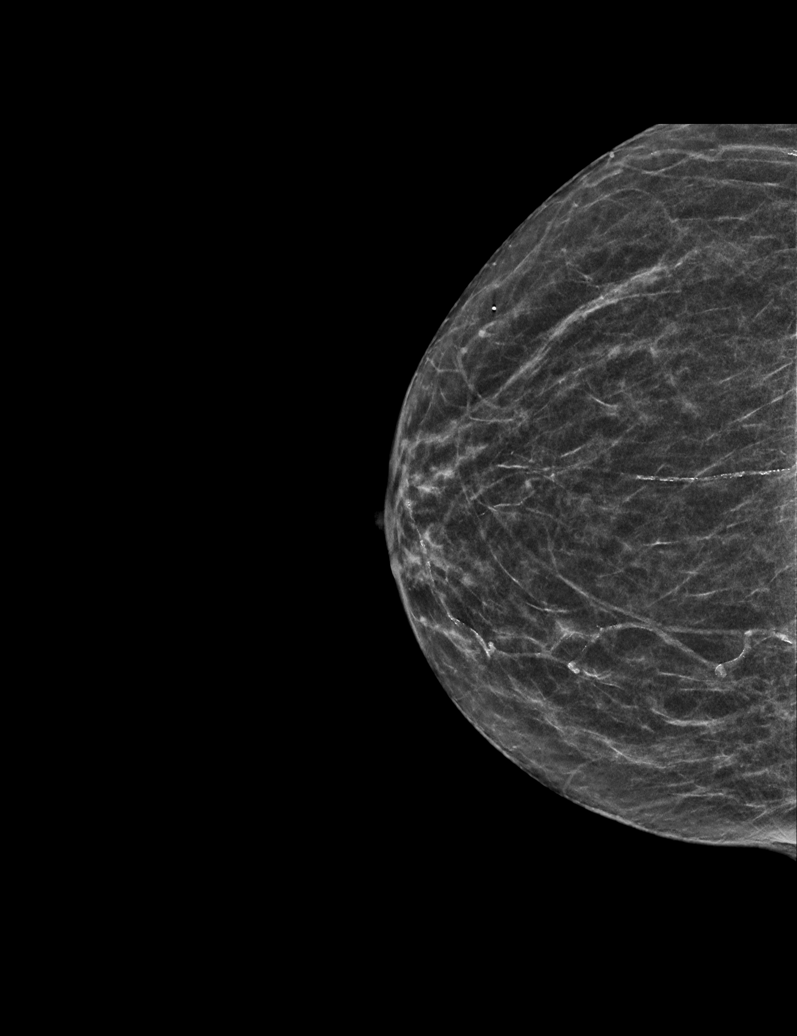

[R MLO synth-2D]
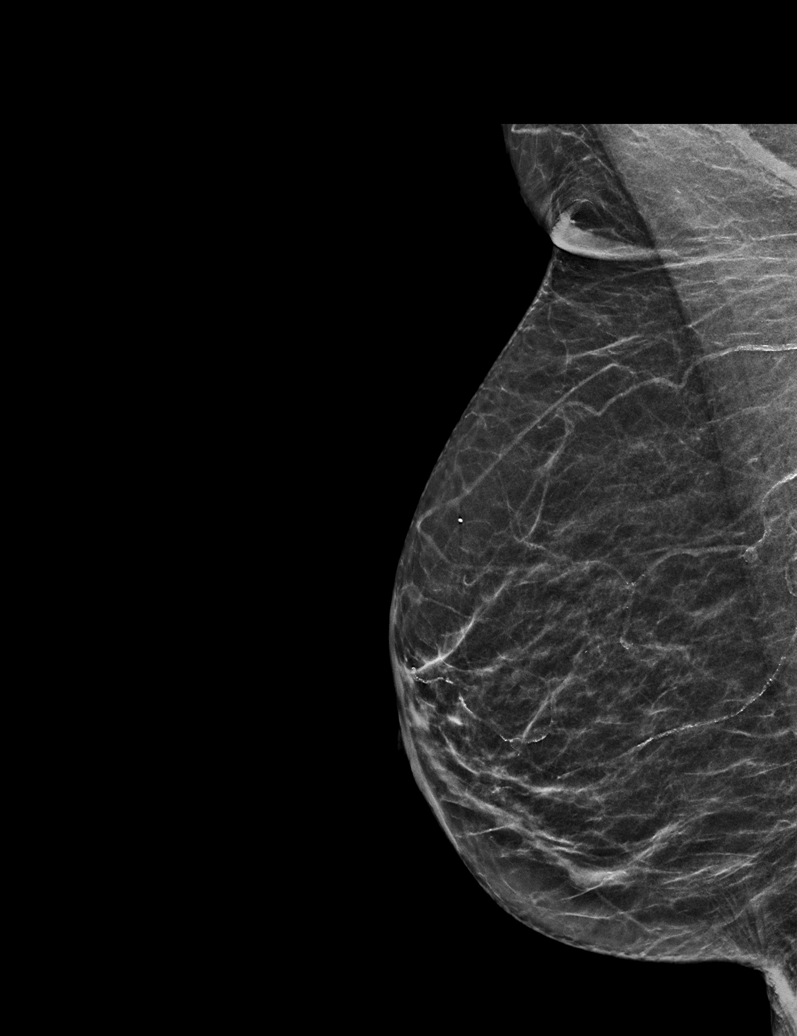

[L MLO synth-2D (2 of 2)]
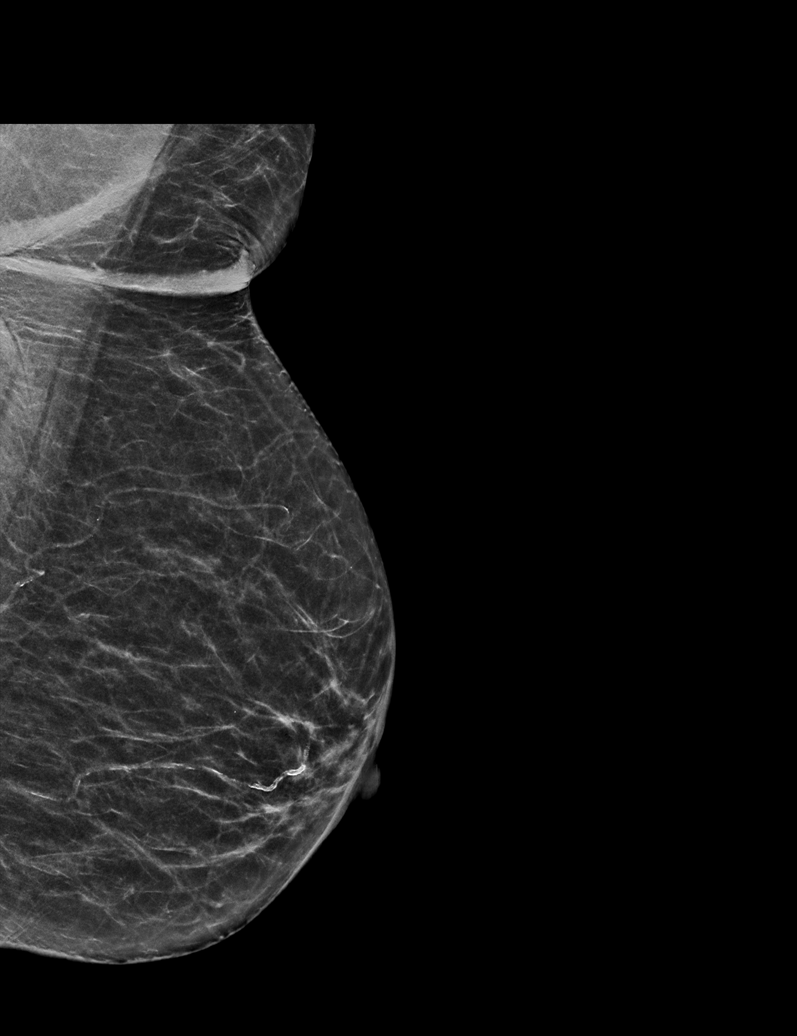

[L MLO tomo · tomo slice 27/52.0]
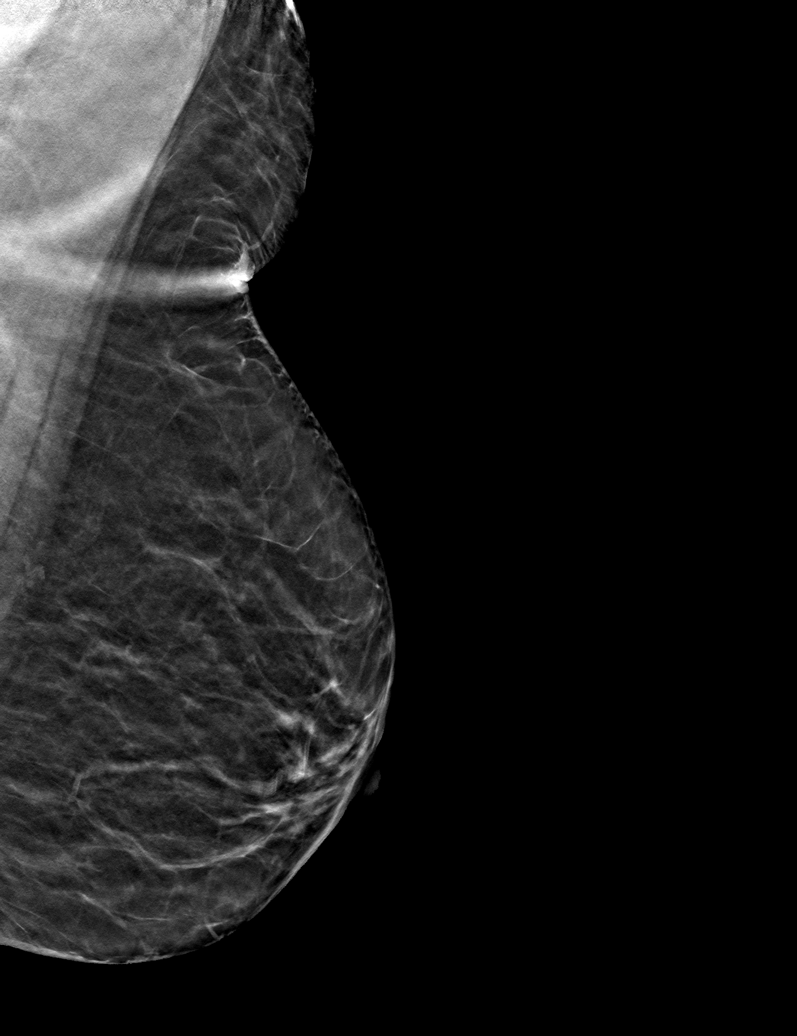

[6 of 30 positions shown; findings below may reference images not displayed]

ACR Breast Density Category b: There are scattered areas of
fibroglandular density.
FINDINGS: There are no findings suspicious for malignancy. Images were
processed with CAD.
IMPRESSION: No mammographic evidence of malignancy. A result letter of this
screening mammogram will be mailed directly to the patient.

RECOMMENDATION:
Screening mammogram in one year. (Code:[id number])

BI-RADS CATEGORY  1: Negative.

## 2020-04-14 DIAGNOSIS — N952 Postmenopausal atrophic vaginitis: Secondary | ICD-10-CM | POA: Diagnosis not present

## 2020-04-20 DIAGNOSIS — H402212 Chronic angle-closure glaucoma, right eye, moderate stage: Secondary | ICD-10-CM | POA: Diagnosis not present

## 2020-04-20 DIAGNOSIS — H402221 Chronic angle-closure glaucoma, left eye, mild stage: Secondary | ICD-10-CM | POA: Diagnosis not present

## 2020-06-29 IMAGING — DX DG CHEST 2V
2 series · 2 of 2 positions shown · non-contrast
Comparison: None.

CLINICAL DATA: Productive cough.

EXAM:
CHEST - 2 VIEW

[chest pa]
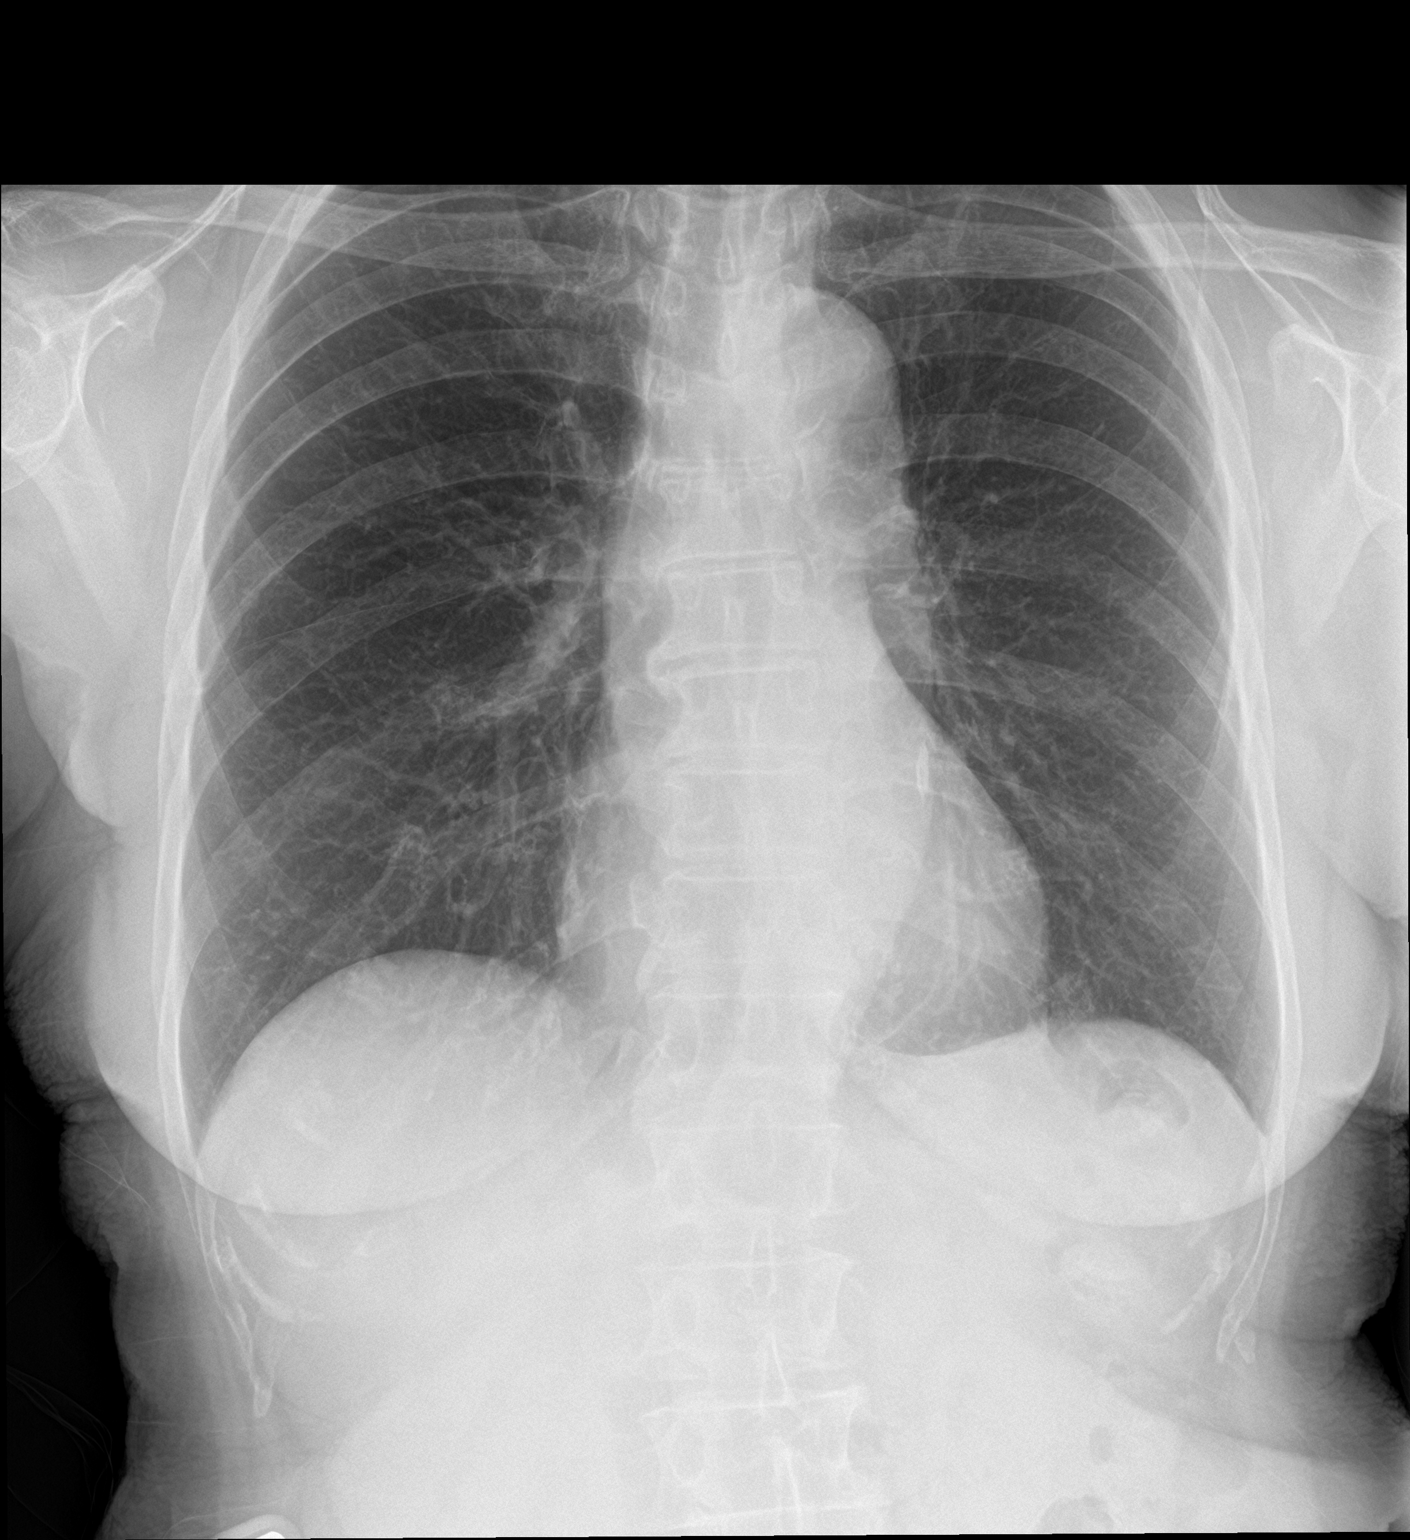

[chest lat]
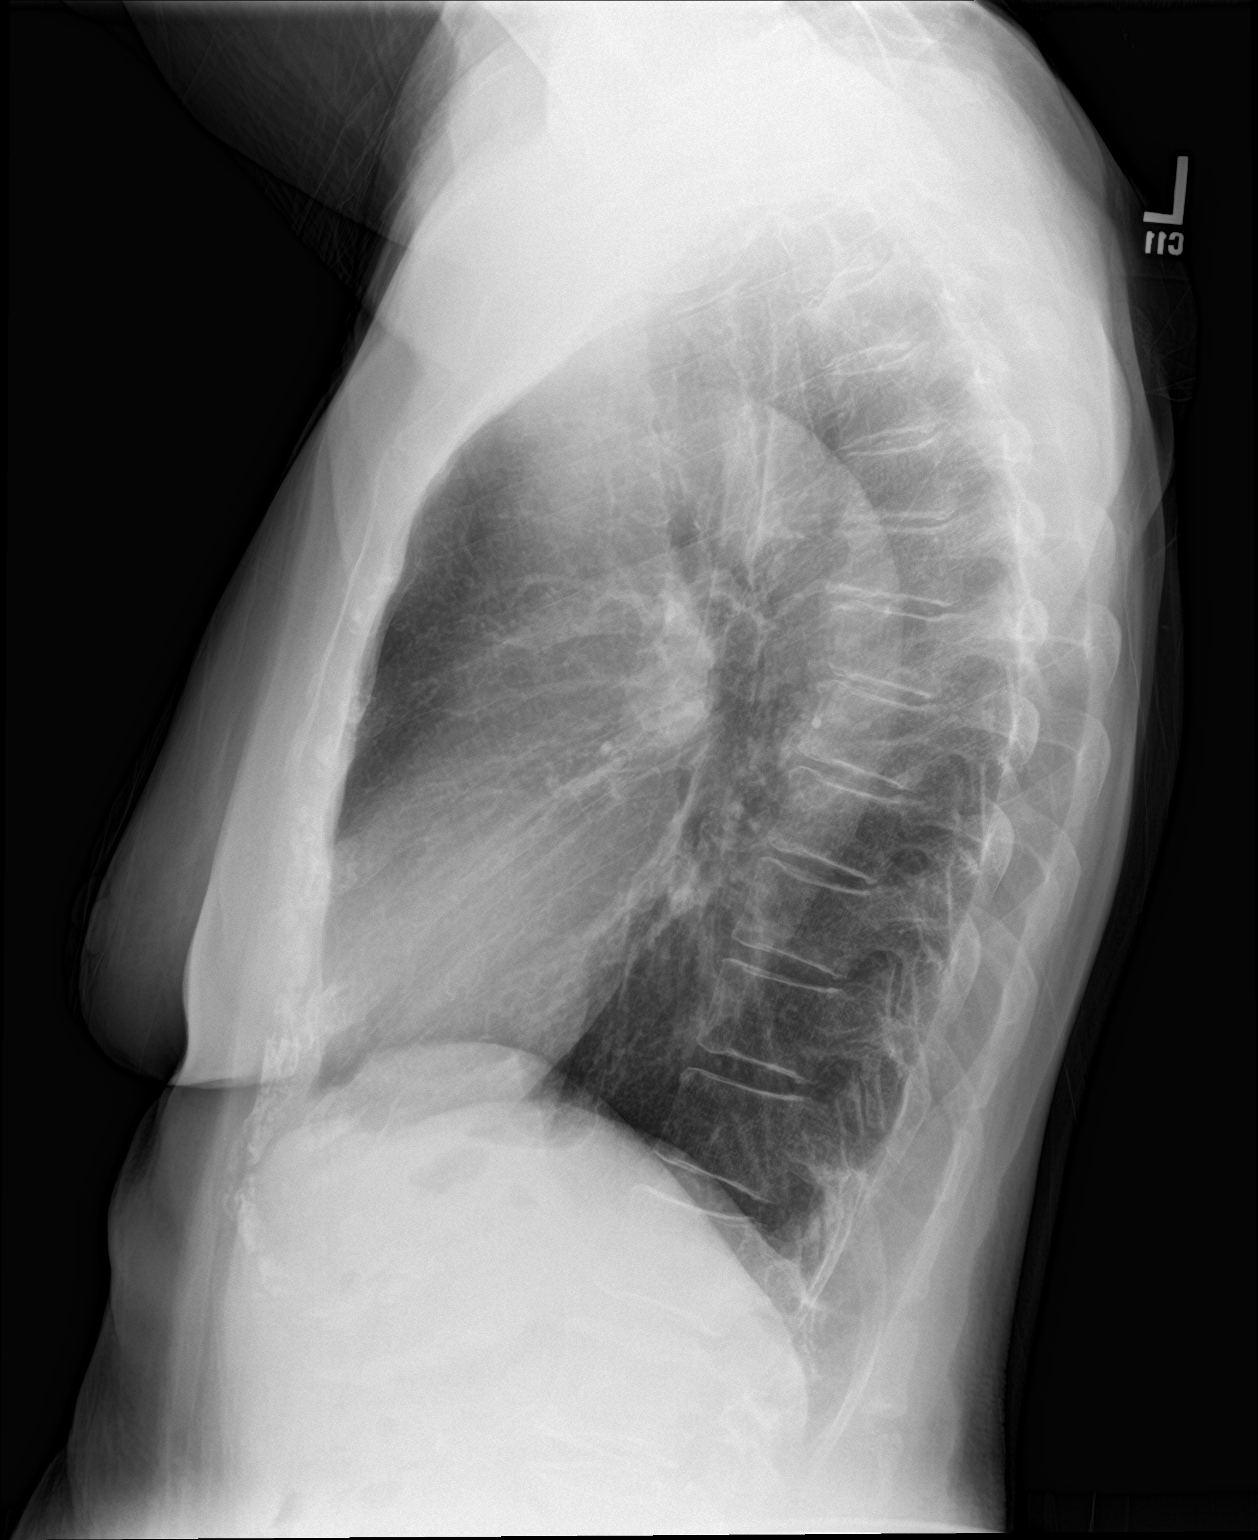

[2 of 2 positions shown; findings below may reference images not displayed]

FINDINGS: The heart size and mediastinal contours are within normal limits.
Both lungs are clear. The visualized skeletal structures are
unremarkable.
IMPRESSION: No active cardiopulmonary disease.

## 2020-12-28 DIAGNOSIS — H402212 Chronic angle-closure glaucoma, right eye, moderate stage: Secondary | ICD-10-CM | POA: Diagnosis not present

## 2020-12-28 DIAGNOSIS — H35033 Hypertensive retinopathy, bilateral: Secondary | ICD-10-CM | POA: Diagnosis not present

## 2020-12-28 DIAGNOSIS — H40033 Anatomical narrow angle, bilateral: Secondary | ICD-10-CM | POA: Diagnosis not present

## 2020-12-28 DIAGNOSIS — H402221 Chronic angle-closure glaucoma, left eye, mild stage: Secondary | ICD-10-CM | POA: Diagnosis not present

## 2021-02-08 ENCOUNTER — Other Ambulatory Visit: Payer: Self-pay | Admitting: Internal Medicine

## 2021-02-08 DIAGNOSIS — Z1231 Encounter for screening mammogram for malignant neoplasm of breast: Secondary | ICD-10-CM

## 2021-02-16 DIAGNOSIS — M810 Age-related osteoporosis without current pathological fracture: Secondary | ICD-10-CM | POA: Diagnosis not present

## 2021-02-16 DIAGNOSIS — E7800 Pure hypercholesterolemia, unspecified: Secondary | ICD-10-CM | POA: Diagnosis not present

## 2021-02-16 DIAGNOSIS — E559 Vitamin D deficiency, unspecified: Secondary | ICD-10-CM | POA: Diagnosis not present

## 2021-02-23 DIAGNOSIS — N39490 Overflow incontinence: Secondary | ICD-10-CM | POA: Diagnosis not present

## 2021-02-23 DIAGNOSIS — N952 Postmenopausal atrophic vaginitis: Secondary | ICD-10-CM | POA: Diagnosis not present

## 2021-02-23 DIAGNOSIS — Z1389 Encounter for screening for other disorder: Secondary | ICD-10-CM | POA: Diagnosis not present

## 2021-02-23 DIAGNOSIS — Z0000 Encounter for general adult medical examination without abnormal findings: Secondary | ICD-10-CM | POA: Diagnosis not present

## 2021-02-23 DIAGNOSIS — E7800 Pure hypercholesterolemia, unspecified: Secondary | ICD-10-CM | POA: Diagnosis not present

## 2021-02-23 DIAGNOSIS — Z1212 Encounter for screening for malignant neoplasm of rectum: Secondary | ICD-10-CM | POA: Diagnosis not present

## 2021-02-23 DIAGNOSIS — R82998 Other abnormal findings in urine: Secondary | ICD-10-CM | POA: Diagnosis not present

## 2021-02-23 DIAGNOSIS — M810 Age-related osteoporosis without current pathological fracture: Secondary | ICD-10-CM | POA: Diagnosis not present

## 2021-02-23 DIAGNOSIS — E559 Vitamin D deficiency, unspecified: Secondary | ICD-10-CM | POA: Diagnosis not present

## 2021-02-23 DIAGNOSIS — Z1331 Encounter for screening for depression: Secondary | ICD-10-CM | POA: Diagnosis not present

## 2021-02-23 DIAGNOSIS — D692 Other nonthrombocytopenic purpura: Secondary | ICD-10-CM | POA: Diagnosis not present

## 2021-02-23 DIAGNOSIS — E663 Overweight: Secondary | ICD-10-CM | POA: Diagnosis not present

## 2021-04-04 ENCOUNTER — Ambulatory Visit
Admission: RE | Admit: 2021-04-04 | Discharge: 2021-04-04 | Disposition: A | Discharge status: Home / Self Care | Payer: PPO | Source: Ambulatory Visit | Attending: Internal Medicine | Admitting: Internal Medicine

## 2021-04-04 ENCOUNTER — Other Ambulatory Visit: Payer: Self-pay

## 2021-04-04 DIAGNOSIS — Z1231 Encounter for screening mammogram for malignant neoplasm of breast: Secondary | ICD-10-CM

## 2021-04-13 DIAGNOSIS — H402212 Chronic angle-closure glaucoma, right eye, moderate stage: Secondary | ICD-10-CM | POA: Diagnosis not present

## 2021-04-13 DIAGNOSIS — H40033 Anatomical narrow angle, bilateral: Secondary | ICD-10-CM | POA: Diagnosis not present

## 2021-04-13 DIAGNOSIS — H402221 Chronic angle-closure glaucoma, left eye, mild stage: Secondary | ICD-10-CM | POA: Diagnosis not present

## 2021-10-09 DIAGNOSIS — R5383 Other fatigue: Secondary | ICD-10-CM | POA: Diagnosis not present

## 2021-10-09 DIAGNOSIS — R002 Palpitations: Secondary | ICD-10-CM | POA: Diagnosis not present

## 2021-10-09 DIAGNOSIS — R42 Dizziness and giddiness: Secondary | ICD-10-CM | POA: Diagnosis not present

## 2021-10-09 DIAGNOSIS — R0781 Pleurodynia: Secondary | ICD-10-CM | POA: Diagnosis not present

## 2021-11-08 DIAGNOSIS — H524 Presbyopia: Secondary | ICD-10-CM | POA: Diagnosis not present

## 2021-11-08 DIAGNOSIS — H35033 Hypertensive retinopathy, bilateral: Secondary | ICD-10-CM | POA: Diagnosis not present

## 2021-11-08 DIAGNOSIS — H402212 Chronic angle-closure glaucoma, right eye, moderate stage: Secondary | ICD-10-CM | POA: Diagnosis not present

## 2021-11-08 DIAGNOSIS — H04123 Dry eye syndrome of bilateral lacrimal glands: Secondary | ICD-10-CM | POA: Diagnosis not present

## 2021-11-08 DIAGNOSIS — H40033 Anatomical narrow angle, bilateral: Secondary | ICD-10-CM | POA: Diagnosis not present

## 2021-11-12 DIAGNOSIS — R0781 Pleurodynia: Secondary | ICD-10-CM | POA: Diagnosis not present

## 2021-11-12 DIAGNOSIS — J4520 Mild intermittent asthma, uncomplicated: Secondary | ICD-10-CM | POA: Diagnosis not present

## 2021-11-12 DIAGNOSIS — R42 Dizziness and giddiness: Secondary | ICD-10-CM | POA: Diagnosis not present

## 2021-11-12 DIAGNOSIS — R002 Palpitations: Secondary | ICD-10-CM | POA: Diagnosis not present

## 2022-02-25 DIAGNOSIS — E7800 Pure hypercholesterolemia, unspecified: Secondary | ICD-10-CM | POA: Diagnosis not present

## 2022-02-25 DIAGNOSIS — R7989 Other specified abnormal findings of blood chemistry: Secondary | ICD-10-CM | POA: Diagnosis not present

## 2022-02-25 DIAGNOSIS — E559 Vitamin D deficiency, unspecified: Secondary | ICD-10-CM | POA: Diagnosis not present

## 2022-03-01 DIAGNOSIS — Z7982 Long term (current) use of aspirin: Secondary | ICD-10-CM | POA: Diagnosis not present

## 2022-03-01 DIAGNOSIS — H409 Unspecified glaucoma: Secondary | ICD-10-CM | POA: Diagnosis not present

## 2022-03-01 DIAGNOSIS — J309 Allergic rhinitis, unspecified: Secondary | ICD-10-CM | POA: Diagnosis not present

## 2022-03-01 DIAGNOSIS — Z9849 Cataract extraction status, unspecified eye: Secondary | ICD-10-CM | POA: Diagnosis not present

## 2022-03-01 DIAGNOSIS — E663 Overweight: Secondary | ICD-10-CM | POA: Diagnosis not present

## 2022-03-01 DIAGNOSIS — J4520 Mild intermittent asthma, uncomplicated: Secondary | ICD-10-CM | POA: Diagnosis not present

## 2022-03-01 DIAGNOSIS — M810 Age-related osteoporosis without current pathological fracture: Secondary | ICD-10-CM | POA: Diagnosis not present

## 2022-03-01 DIAGNOSIS — M5459 Other low back pain: Secondary | ICD-10-CM | POA: Diagnosis not present

## 2022-03-01 DIAGNOSIS — E559 Vitamin D deficiency, unspecified: Secondary | ICD-10-CM | POA: Diagnosis not present

## 2022-03-01 DIAGNOSIS — K649 Unspecified hemorrhoids: Secondary | ICD-10-CM | POA: Diagnosis not present

## 2022-03-01 DIAGNOSIS — K5900 Constipation, unspecified: Secondary | ICD-10-CM | POA: Diagnosis not present

## 2022-03-04 DIAGNOSIS — E663 Overweight: Secondary | ICD-10-CM | POA: Diagnosis not present

## 2022-03-04 DIAGNOSIS — Z1331 Encounter for screening for depression: Secondary | ICD-10-CM | POA: Diagnosis not present

## 2022-03-04 DIAGNOSIS — R82998 Other abnormal findings in urine: Secondary | ICD-10-CM | POA: Diagnosis not present

## 2022-03-04 DIAGNOSIS — N952 Postmenopausal atrophic vaginitis: Secondary | ICD-10-CM | POA: Diagnosis not present

## 2022-03-04 DIAGNOSIS — D692 Other nonthrombocytopenic purpura: Secondary | ICD-10-CM | POA: Diagnosis not present

## 2022-03-04 DIAGNOSIS — Z1212 Encounter for screening for malignant neoplasm of rectum: Secondary | ICD-10-CM | POA: Diagnosis not present

## 2022-03-04 DIAGNOSIS — M810 Age-related osteoporosis without current pathological fracture: Secondary | ICD-10-CM | POA: Diagnosis not present

## 2022-03-04 DIAGNOSIS — E559 Vitamin D deficiency, unspecified: Secondary | ICD-10-CM | POA: Diagnosis not present

## 2022-03-04 DIAGNOSIS — R000 Tachycardia, unspecified: Secondary | ICD-10-CM | POA: Diagnosis not present

## 2022-03-04 DIAGNOSIS — Z0000 Encounter for general adult medical examination without abnormal findings: Secondary | ICD-10-CM | POA: Diagnosis not present

## 2022-03-04 DIAGNOSIS — Z1339 Encounter for screening examination for other mental health and behavioral disorders: Secondary | ICD-10-CM | POA: Diagnosis not present

## 2022-03-04 DIAGNOSIS — K649 Unspecified hemorrhoids: Secondary | ICD-10-CM | POA: Diagnosis not present

## 2022-03-04 DIAGNOSIS — E7800 Pure hypercholesterolemia, unspecified: Secondary | ICD-10-CM | POA: Diagnosis not present

## 2022-03-04 DIAGNOSIS — N39490 Overflow incontinence: Secondary | ICD-10-CM | POA: Diagnosis not present

## 2022-05-06 DIAGNOSIS — H402221 Chronic angle-closure glaucoma, left eye, mild stage: Secondary | ICD-10-CM | POA: Diagnosis not present

## 2022-05-06 DIAGNOSIS — H40033 Anatomical narrow angle, bilateral: Secondary | ICD-10-CM | POA: Diagnosis not present

## 2022-05-06 DIAGNOSIS — H402212 Chronic angle-closure glaucoma, right eye, moderate stage: Secondary | ICD-10-CM | POA: Diagnosis not present

## 2022-05-06 DIAGNOSIS — H1045 Other chronic allergic conjunctivitis: Secondary | ICD-10-CM | POA: Diagnosis not present

## 2022-05-22 DIAGNOSIS — K5900 Constipation, unspecified: Secondary | ICD-10-CM | POA: Diagnosis not present

## 2022-08-12 ENCOUNTER — Emergency Department (HOSPITAL_COMMUNITY)
Admission: EM | Admit: 2022-08-12 | Discharge: 2022-08-12 | Disposition: A | Discharge status: Home / Self Care | Payer: PPO | Attending: Student | Admitting: Student

## 2022-08-12 ENCOUNTER — Encounter (HOSPITAL_COMMUNITY): Payer: Self-pay

## 2022-08-12 ENCOUNTER — Emergency Department (HOSPITAL_COMMUNITY): Payer: PPO

## 2022-08-12 ENCOUNTER — Other Ambulatory Visit: Payer: Self-pay

## 2022-08-12 DIAGNOSIS — H538 Other visual disturbances: Secondary | ICD-10-CM | POA: Diagnosis not present

## 2022-08-12 DIAGNOSIS — R42 Dizziness and giddiness: Secondary | ICD-10-CM | POA: Insufficient documentation

## 2022-08-12 DIAGNOSIS — R079 Chest pain, unspecified: Secondary | ICD-10-CM | POA: Diagnosis not present

## 2022-08-12 DIAGNOSIS — R110 Nausea: Secondary | ICD-10-CM | POA: Diagnosis not present

## 2022-08-12 LAB — CBC
HCT: 41.7 % (ref 36.0–46.0)
Hemoglobin: 13.5 g/dL (ref 12.0–15.0)
MCH: 28.9 pg (ref 26.0–34.0)
MCHC: 32.4 g/dL (ref 30.0–36.0)
MCV: 89.3 fL (ref 80.0–100.0)
Platelets: 158 10*3/uL (ref 150–400)
RBC: 4.67 MIL/uL (ref 3.87–5.11)
RDW: 13.4 % (ref 11.5–15.5)
WBC: 5 10*3/uL (ref 4.0–10.5)
nRBC: 0 % (ref 0.0–0.2)

## 2022-08-12 LAB — HEPATIC FUNCTION PANEL
ALT: 14 U/L (ref 0–44)
AST: 21 U/L (ref 15–41)
Albumin: 3.6 g/dL (ref 3.5–5.0)
Alkaline Phosphatase: 70 U/L (ref 38–126)
Bilirubin, Direct: 0.2 mg/dL (ref 0.0–0.2)
Indirect Bilirubin: 1 mg/dL — ABNORMAL HIGH (ref 0.3–0.9)
Total Bilirubin: 1.2 mg/dL (ref 0.3–1.2)
Total Protein: 6.7 g/dL (ref 6.5–8.1)

## 2022-08-12 LAB — BASIC METABOLIC PANEL
Anion gap: 8 (ref 5–15)
BUN: 14 mg/dL (ref 8–23)
CO2: 24 mmol/L (ref 22–32)
Calcium: 9.4 mg/dL (ref 8.9–10.3)
Chloride: 107 mmol/L (ref 98–111)
Creatinine, Ser: 0.9 mg/dL (ref 0.44–1.00)
GFR, Estimated: >60 mL/min (ref >60)
Glucose, Bld: 100 mg/dL — ABNORMAL HIGH (ref 70–99)
Potassium: 3.7 mmol/L (ref 3.5–5.1)
Sodium: 139 mmol/L (ref 135–145)

## 2022-08-12 LAB — URINALYSIS, ROUTINE W REFLEX MICROSCOPIC
Bacteria, UA: NONE SEEN
Bilirubin Urine: NEGATIVE
Glucose, UA: NEGATIVE mg/dL
Ketones, ur: NEGATIVE mg/dL
Leukocytes,Ua: NEGATIVE
Nitrite: NEGATIVE
Protein, ur: NEGATIVE mg/dL
Specific Gravity, Urine: 1.021 (ref 1.005–1.030)
pH: 5 (ref 5.0–8.0)

## 2022-08-12 LAB — TROPONIN I (HIGH SENSITIVITY)
Troponin I (High Sensitivity): 5 ng/L (ref <18)
Troponin I (High Sensitivity): 8 ng/L (ref <18)

## 2022-08-12 LAB — LIPASE, BLOOD: Lipase: 38 U/L (ref 11–51)

## 2022-08-12 MED ORDER — LACTATED RINGERS IV BOLUS
1000.0000 mL | Freq: Once | INTRAVENOUS | Status: AC
  Administered 2022-08-12: 1000 mL via INTRAVENOUS

## 2022-08-12 NOTE — ED Notes (Signed)
Patient pulled back into triage for reassessment due to elevated BP patient has no new symptoms.  PA reasssed and CT head ordered.

## 2022-08-12 NOTE — ED Provider Triage Note (Signed)
Emergency Medicine Provider Triage Evaluation Note  Ebony Kennedy , a 81 y.o. female  was evaluated in triage.  Pt complains of left rib pain.  Patient states for about 1 week she has had pain in her bottom of her left rib and top of her left upper quadrant of her abdomen.  It is worse when she takes deep breaths, coughs, or moves.  He has had associated dizziness, and blurry vision, nausea.  Denies chest pain or shortness of breath, no weakness or numbness, no speech changes. No recent trauma  Review of Systems  Positive:  Negative:   Physical Exam  BP (!) 156/100   Pulse 85   Temp 98.8 F (37.1 C) (Oral)   Resp 14   Ht 5' (1.524 m)   Wt 67.1 kg   SpO2 98%   BMI 28.90 kg/m  Gen:   Awake, no distress   Resp:  Normal effort  MSK:   Moves extremities without difficulty  Other:  PERRLA, reproducible pain at the left lower rib cage  Medical Decision Making  Medically screening exam initiated at 11:54 AM.  Appropriate orders placed.  Ebony Kennedy was informed that the remainder of the evaluation will be completed by another provider, this initial triage assessment does not replace that evaluation, and the importance of remaining in the ED until their evaluation is complete.     Adolphus Birchwood, [place] 08/12/22 1156

## 2022-08-12 NOTE — Discharge Instructions (Signed)
Please follow up with your primary doctor to discuss your blood pressure. Stay well hydrated. Return to ED as needed.

## 2022-08-12 NOTE — ED Notes (Signed)
Patient verbalizes understanding of discharge instructions. Opportunity for questioning and answers were provided. Armband removed by staff, pt discharged from ED.  

## 2022-08-12 NOTE — ED Triage Notes (Signed)
Complains of dizziness, blurry vision and pain under left rib cage x 1 week.  Reports can feel it when she coughs or moves.   +nausea -vomiting

## 2022-08-12 NOTE — ED Provider Notes (Signed)
[facility] EMERGENCY DEPARTMENT Provider Note   CSN: [id number] Arrival date & time: 08/12/22  1105     History  Chief Complaint  Patient presents with   Dizziness    Ebony Kennedy is a 81 y.o. female. Presenting after 1 day of nausea, dizziness, and blurry vision.  The symptoms occurred yesterday morning.  Since patient felt nauseous she has not anything to eat in the past 24 hours.  Today she denies any dizziness, nausea, or blurry vision.  The symptoms have resolved, however she now feels lightheaded.  She denies any headache or vision changes today.  She does use eyedrops due to multiple prior eye procedures, however she does not take any additional medications.  Denies any recent falls or numbness/tingling in her extremities.  She does report getting a flu shot 3 days ago.  Dizziness Associated symptoms: nausea   Associated symptoms: no chest pain, no palpitations, no shortness of breath and no vomiting        Home Medications Prior to Admission medications   Medication Sig Start Date End Date Taking? Authorizing Provider  Cholecalciferol (VITAMIN D) 2000 UNITS tablet Take 2,000 Units by mouth daily.    [provider]  fluticasone (FLONASE) 50 MCG/ACT nasal spray Place 1 spray into both nostrils daily. 01/18/19   Zigmund Gottron, NP      Allergies    Patient has no known allergies.    Review of Systems   Review of Systems  Constitutional:  Negative for chills and fever.  HENT:  Negative for ear pain and sore throat.   Eyes:  Negative for pain and visual disturbance.  Respiratory:  Negative for cough and shortness of breath.   Cardiovascular:  Negative for chest pain and palpitations.  Gastrointestinal:  Positive for nausea. Negative for abdominal pain and vomiting.  Genitourinary:  Negative for dysuria and hematuria.  Musculoskeletal:  Negative for arthralgias and back pain.  Skin:  Negative for color change and rash.   Neurological:  Positive for dizziness and light-headedness. Negative for seizures and syncope.  All other systems reviewed and are negative.   Physical Exam Updated Vital Signs BP (!) 157/90   Pulse 72   Temp 98.4 F (36.9 C)   Resp 16   Ht 5' (1.524 m)   Wt 67.1 kg   SpO2 96%   BMI 28.90 kg/m  Physical Exam Vitals and nursing note reviewed.  Constitutional:      General: She is not in acute distress.    Appearance: She is well-developed.  HENT:     Head: Normocephalic and atraumatic.  Eyes:     Conjunctiva/sclera: Conjunctivae normal.     Comments: Visual acuity grossly intact  Cardiovascular:     Rate and Rhythm: Normal rate and regular rhythm.     Heart sounds: No murmur heard. Pulmonary:     Effort: Pulmonary effort is normal. No respiratory distress.     Breath sounds: Normal breath sounds.  Abdominal:     Palpations: Abdomen is soft.     Tenderness: There is no abdominal tenderness.  Musculoskeletal:        General: No swelling.     Cervical back: Neck supple.  Skin:    General: Skin is warm and dry.     Capillary Refill: Capillary refill takes less than 2 seconds.  Neurological:     Mental Status: She is alert.     GCS: GCS eye subscore is 4. GCS verbal subscore is  5. GCS motor subscore is 6.     Cranial Nerves: Cranial nerves 2-12 are intact.     Sensory: Sensation is intact.     Motor: Motor function is intact.     Coordination: Coordination is intact.     Gait: Gait is intact.  Psychiatric:        Mood and Affect: Mood normal.     ED Results / Procedures / Treatments   Labs (all labs ordered are listed, but only abnormal results are displayed) Labs Reviewed  URINALYSIS, ROUTINE W REFLEX MICROSCOPIC - Abnormal; Notable for the following components:      Result Value   APPearance HAZY (*)    Hgb urine dipstick MODERATE (*)    All other components within normal limits  BASIC METABOLIC PANEL - Abnormal; Notable for the following components:    Glucose, Bld 100 (*)    All other components within normal limits  HEPATIC FUNCTION PANEL - Abnormal; Notable for the following components:   Indirect Bilirubin 1.0 (*)    All other components within normal limits  CBC  LIPASE, BLOOD  TROPONIN I (HIGH SENSITIVITY)  TROPONIN I (HIGH SENSITIVITY)    EKG None  Radiology CT Head Wo Contrast  Result Date: 08/12/2022 CLINICAL DATA:  Dizziness EXAM: CT HEAD WITHOUT CONTRAST TECHNIQUE: Contiguous axial images were obtained from the base of the skull through the vertex without intravenous contrast. RADIATION DOSE REDUCTION: This exam was performed according to the departmental dose-optimization program which includes automated exposure control, adjustment of the mA and/or kV according to patient size and/or use of iterative reconstruction technique. COMPARISON:  CT brain 06/07/2019 FINDINGS: Brain: No acute territorial infarction, hemorrhage, or intracranial mass. Atrophy. Mild chronic small vessel ischemic changes of the white matter. Nonenlarged ventricles Vascular: No hyperdense vessels.  Carotid vascular calcification Skull: Normal. Negative for fracture or focal lesion. Sinuses/Orbits: No acute finding. Other: None IMPRESSION: 1. No CT evidence for acute intracranial abnormality. 2. Atrophy and mild chronic small vessel ischemic changes of the white matter Electronically Signed   By: Donavan Foil M.D.   On: 08/12/2022 19:25   DG Chest 2 View  Result Date: 08/12/2022 CLINICAL DATA:  chest pain EXAM: CHEST - 2 VIEW COMPARISON:  None Available. FINDINGS: The heart size and mediastinal contours are within normal limits. Both lungs are clear. No visible pleural effusions or pneumothorax. No acute osseous abnormality. Mild S-shaped thoracolumbar curvature. IMPRESSION: No evidence of acute cardiopulmonary disease. Electronically Signed   By: Margaretha Sheffield M.D.   On: 08/12/2022 13:23    Procedures Procedures    Medications Ordered in  ED Medications  lactated ringers bolus 1,000 mL (0 mLs Intravenous Stopped 08/12/22 2030)    ED Course/ Medical Decision Making/ A&P Clinical Course as of 08/12/22 2339  Mon Aug 12, 2022  1750 Patient reassessed in triage. Blood pressure increased to 182/113. Given complaints of dizziness and blurred vision, CT head was ordered. Denies chest pain, n/v [EC]    Clinical Course User Index [EC] Tonye Pearson, PA-C                           Medical Decision Making  81 year old female with past medical history of arthritis presenting after 1 day of dizziness, blurred vision, lightheadedness, and nausea. These symptoms were present yesterday, but have all resolved today besides the intermittent lightheadedness.   Differential diagnosis includes dehydration, electrolyte abnormalities, arrhythmia, UTI, intracranial hemorrhage, hypertensive urgency/emergency, response to  flu shot, viral syndrome.   Labs reviewed and overall reassuring.  No AKI, significant electrolyte abnormalities, leukocytosis, or significant anemia.  Lipase within normal limits.  Initial troponin 5.  Urinalysis without signs of infection.  Chest x-ray reviewed and reassuring.  No signs of focal opacity/pneumonia, pneumothorax, or pleural effusion.  EKG showed sinus rhythm, rate 83. No ST elevations or depressions. Left axis deviated.  CT head reassuring.  No acute abnormalities.  On initial exam patient had a nonfocal neurologic exam, but did have reported lightheadedness upon standing.  Suspect this is due to poor po intake in setting of patient's symptoms yesterday. Patient was given 1 L IV fluids.  Reports resolution of her symptoms.  Low suspicion for emergent condition at this time.  Was noted to be hypertensive while in the ED up to SBP 180, however this did downtrend to 150.  Patient does not currently take any medication for hypertension, I recommended very close follow-up with her primary doctor to further discuss  blood pressure management. I discussed the above results with the patient and all questions were answered.  Shared decision-making used determined patient was safe for discharge at this time.        Final Clinical Impression(s) / ED Diagnoses Final diagnoses:  Lightheadedness    Rx / DC Orders ED Discharge Orders     None         Rosine Abe, MD 08/12/22 8185    Teressa Lower, MD 08/13/22 1215

## 2022-08-15 DIAGNOSIS — H8110 Benign paroxysmal vertigo, unspecified ear: Secondary | ICD-10-CM | POA: Diagnosis not present

## 2022-08-15 DIAGNOSIS — I10 Essential (primary) hypertension: Secondary | ICD-10-CM | POA: Diagnosis not present

## 2022-08-28 ENCOUNTER — Ambulatory Visit: Payer: PPO | Admitting: Internal Medicine

## 2022-08-28 ENCOUNTER — Encounter: Payer: Self-pay | Admitting: Internal Medicine

## 2022-08-28 VITALS — BP 128/68 | HR 70 | Ht 60.0 in | Wt 137.0 lb

## 2022-08-28 DIAGNOSIS — K625 Hemorrhage of anus and rectum: Secondary | ICD-10-CM | POA: Diagnosis not present

## 2022-08-28 DIAGNOSIS — K5901 Slow transit constipation: Secondary | ICD-10-CM | POA: Diagnosis not present

## 2022-08-28 DIAGNOSIS — R634 Abnormal weight loss: Secondary | ICD-10-CM

## 2022-08-28 DIAGNOSIS — R194 Change in bowel habit: Secondary | ICD-10-CM

## 2022-08-28 MED ORDER — NA SULFATE-K SULFATE-MG SULF 17.5-3.13-1.6 GM/177ML PO SOLN: 1.0000 | Freq: Once | ORAL | 0 refills | Status: AC

## 2022-08-28 NOTE — Progress Notes (Signed)
HISTORY OF PRESENT ILLNESS:  Ebony Kennedy is a 81 y.o. female, part-time Insurance underwriter at [employer], who presents today at the request of Dr. Nadeen Landau of general surgery regarding constipation and the need for colonoscopy.  I last saw the patient December 2013 when she underwent screening colonoscopy.  The examination was normal except for mild diverticulosis.  Colonoscopy prior to that, in 2006, was unremarkable.  Patient reports that she has been having trouble with progressive constipation over the past 6 to 12 months.  She describes having bowel movements once every 2 or 3 weeks.  She was also having some rectal bleeding felt secondary to hemorrhoids for which her primary care provider, Dr. Osborne Casco, referred her to Dr. Dema Severin.  She was seen by Dr. Dema Severin May 22, 2022.  At that time she underwent endoscopy which revealed small internal hemorrhoids.  No additional significant abnormalities.  She was told to take MiraLAX daily and referred to gastroenterology.  Patient tells me that she has been taking 1 dose of MiraLAX daily.  Despite that she continues with infrequent bowel movements.  This does make her abdomen somewhat uncomfortable.  She continues to see moderate blood at times with defecation.  She also reports at least 20 pound weight loss over the past year.  Associated with this has been a decreased appetite.  Review of blood work from September 2023 shows unremarkable comprehensive metabolic panel.  Normal CBC with hemoglobin 13.5.  CT scan and MRI of the abdomen to evaluate microscopic hematuria was performed in 2018.  No significant abdominal abnormalities.  REVIEW OF SYSTEMS:  All non-GI ROS negative unless otherwise stated in the HPI. Past Medical History:  Diagnosis Date   Angina pectoris    Arthritis     Past Surgical History:  Procedure Laterality Date   CARDIAC CATHETERIZATION  2008   normal   HEMORRHOID SURGERY  2000   HERNIA REPAIR  2000   Inquinal Bilateral    TUBAL LIGATION  1977    Social History Ebony Kennedy  reports that she has never smoked. She has never used smokeless tobacco. She reports current alcohol use. She reports that she does not use drugs.  family history is not on file.  No Known Allergies     PHYSICAL EXAMINATION: Vital signs: BP 128/68   Pulse 70   Ht 5' (1.524 m)   Wt 137 lb (62.1 kg)   BMI 26.76 kg/m   Constitutional: generally well-appearing, no acute distress Psychiatric: alert and oriented x3, cooperative Eyes: extraocular movements intact, anicteric, conjunctiva pink Mouth: oral pharynx moist, no lesions Neck: supple no lymphadenopathy Cardiovascular: heart regular rate and rhythm, no murmur Lungs: clear to auscultation bilaterally Abdomen: soft, nontender, nondistended, no obvious ascites, no peritoneal signs, normal bowel sounds, no organomegaly Rectal: Deferred to colonoscopy (evaluation with Dr. Dema Severin June 2023 noted) Extremities: no clubbing, cyanosis, or lower extremity edema bilaterally Skin: no lesions on visible extremities Neuro: No focal deficits.  Cranial nerves intact  ASSESSMENT:  1.  Change in bowel habits with progressive constipation and weight loss.  No rectal bleeding.  Negative recent endoscopy save small hemorrhoids. 2.  Unremarkable colonoscopy 2006 and 2013   PLAN:  1.  Increase MiraLAX to 2 doses daily for 1 week.  If ineffective, increase to 3 doses daily 2.  Schedule colonoscopy to evaluate change in bowel habits, progressive constipation, minor rectal bleeding, and weight loss.The nature of the procedure, as well as the risks, benefits, and alternatives were carefully and thoroughly  reviewed with the patient. Ample time for discussion and questions allowed. The patient understood, was satisfied, and agreed to proceed.  A total time of 45 minutes was spent preparing to see the patient, reviewing outside records and data, obtaining comprehensive history, performing  medically appropriate physical examination, educating counseling the patient regarding the above listed issues, and documenting clinical information in the health record.

## 2022-08-28 NOTE — Patient Instructions (Signed)
_______________________________________________________  If you are age 79 or older, your body mass index should be between 23-30. Your Body mass index is 26.76 kg/m. If this is out of the aforementioned range listed, please consider follow up with your Primary Care Provider.  If you are age 74 or younger, your body mass index should be between 19-25. Your Body mass index is 26.76 kg/m. If this is out of the aformentioned range listed, please consider follow up with your Primary Care Provider.   ________________________________________________________  The [facility] providers would like to encourage you to use [facility] to communicate with providers for non-urgent requests or questions.  Due to long hold times on the telephone, sending your provider a message by [facility] be a faster and more efficient way to get a response.  Please allow 48 business hours for a response.  Please remember that this is for non-urgent requests.  _______________________________________________________  Dennis Bast have been scheduled for a colonoscopy. Please follow written instructions given to you at your visit today.  Please pick up your prep supplies at the pharmacy within the next 1-3 days. If you use inhalers (even only as needed), please bring them with you on the day of your procedure.

## 2022-08-29 DIAGNOSIS — I10 Essential (primary) hypertension: Secondary | ICD-10-CM | POA: Diagnosis not present

## 2022-08-29 DIAGNOSIS — M79602 Pain in left arm: Secondary | ICD-10-CM | POA: Diagnosis not present

## 2022-08-29 DIAGNOSIS — H8110 Benign paroxysmal vertigo, unspecified ear: Secondary | ICD-10-CM | POA: Diagnosis not present

## 2022-08-29 DIAGNOSIS — M19012 Primary osteoarthritis, left shoulder: Secondary | ICD-10-CM | POA: Diagnosis not present

## 2022-09-16 ENCOUNTER — Telehealth: Payer: Self-pay | Admitting: Internal Medicine

## 2022-09-16 NOTE — Telephone Encounter (Signed)
Lm on vm 

## 2022-09-16 NOTE — Telephone Encounter (Signed)
Inbound call from patient regarding prep instructions. Please give a call back to further advise.  Thank you

## 2022-09-18 ENCOUNTER — Ambulatory Visit (AMBULATORY_SURGERY_CENTER): Payer: PPO | Admitting: Internal Medicine

## 2022-09-18 ENCOUNTER — Encounter: Payer: Self-pay | Admitting: Internal Medicine

## 2022-09-18 VITALS — BP 136/79 | HR 65 | Temp 97.3°F | Resp 13 | Ht 60.0 in | Wt 137.0 lb

## 2022-09-18 DIAGNOSIS — K5730 Diverticulosis of large intestine without perforation or abscess without bleeding: Secondary | ICD-10-CM

## 2022-09-18 DIAGNOSIS — R634 Abnormal weight loss: Secondary | ICD-10-CM | POA: Diagnosis not present

## 2022-09-18 DIAGNOSIS — R194 Change in bowel habit: Secondary | ICD-10-CM

## 2022-09-18 DIAGNOSIS — K625 Hemorrhage of anus and rectum: Secondary | ICD-10-CM | POA: Diagnosis not present

## 2022-09-18 DIAGNOSIS — D120 Benign neoplasm of cecum: Secondary | ICD-10-CM | POA: Diagnosis not present

## 2022-09-18 DIAGNOSIS — K5901 Slow transit constipation: Secondary | ICD-10-CM | POA: Diagnosis not present

## 2022-09-18 MED ORDER — SODIUM CHLORIDE 0.9 % IV SOLN: 500.0000 mL | Freq: Once | INTRAVENOUS | Status: AC

## 2022-09-18 NOTE — Progress Notes (Signed)
Report to PACU, RN, vss, BBS= Clear.  

## 2022-09-18 NOTE — Patient Instructions (Addendum)
RECOMMENDATIONS: - Repeat colonoscopy is not recommended for surveillance. - Patient has a contact number available for emergencies. The signs and symptoms of potential delayed complications were discussed with the patient. Return to normal activities tomorrow. Written discharge instructions were provided to the patient. - Resume previous diet. - Continue present medications. - Await pathology results. - Please schedule contrast-enhanced CT scan of the abdomen and pelvis "unexplained weight loss". If negative, she will return to the care of her PCP   YOU HAD AN ENDOSCOPIC PROCEDURE TODAY AT [place]:   Refer to the procedure report that was given to you for any specific questions about what was found during the examination.  If the procedure report does not answer your questions, please call your gastroenterologist to clarify.  If you requested that your care partner not be given the details of your procedure findings, then the procedure report has been included in a sealed envelope for you to review at your convenience later.  YOU SHOULD EXPECT: Some feelings of bloating in the abdomen. Passage of more gas than usual.  Walking can help get rid of the air that was put into your GI tract during the procedure and reduce the bloating. If you had a lower endoscopy (such as a colonoscopy or flexible sigmoidoscopy) you Taylinn notice spotting of blood in your stool or on the toilet paper. If you underwent a bowel prep for your procedure, you Teniya not have a normal bowel movement for a few days.  Please Note:  You might notice some irritation and congestion in your nose or some drainage.  This is from the oxygen used during your procedure.  There is no need for concern and it should clear up in a day or so.  SYMPTOMS TO REPORT IMMEDIATELY:  Following lower endoscopy (colonoscopy or flexible sigmoidoscopy):  Excessive amounts of blood in the stool  Significant tenderness or worsening of  abdominal pains  Swelling of the abdomen that is new, acute  Fever of 100F or higher   For urgent or emergent issues, a gastroenterologist can be reached at any hour by calling [phone]. Do not use MyChart messaging for urgent concerns.    DIET:  We do recommend a small meal at first, but then you Carrigan proceed to your regular diet.  Drink plenty of fluids but you should avoid alcoholic beverages for 24 hours.  MEDICATIONS: Continue present medications.  Patient given oral contrast media to take home today for contrast-enhanced CT scan of abdomen and pelvis. Dr. Blanch Media office nurse will call you to schedule this appointment.  Thank you for allowing [place] to provide for your healthcare needs today.  ACTIVITY:  You should plan to take it easy for the rest of today and you should NOT DRIVE or use heavy machinery until tomorrow (because of the sedation medicines used during the test).    FOLLOW UP: Our staff will call the number listed on your records the next business day following your procedure.  We will call around 7:15- 8:00 am to check on you and address any questions or concerns that you Yasaman have regarding the information given to you following your procedure. If we do not reach you, we will leave a message.     If any biopsies were taken you will be contacted by phone or by letter within the next 1-3 weeks.  Please call us at [phone] if you have not heard about the biopsies in 3 weeks.    SIGNATURES/CONFIDENTIALITY:  You and/or your care partner have signed paperwork which will be entered into your electronic medical record.  These signatures attest to the fact that that the information above on your After Visit Summary has been reviewed and is understood.  Full responsibility of the confidentiality of this discharge information lies with you and/or your care-partner.

## 2022-09-18 NOTE — Progress Notes (Signed)
HISTORY OF PRESENT ILLNESS:   Ebony Kennedy is a 81 y.o. female, part-time Insurance underwriter at [employer], who presents today at the request of Dr. Nadeen Kennedy of general surgery regarding constipation and the need for colonoscopy.  I last saw the patient December 2013 when she underwent screening colonoscopy.  The examination was normal except for mild diverticulosis.  Colonoscopy prior to that, in 2006, was unremarkable.   Patient reports that she has been having trouble with progressive constipation over the past 6 to 12 months.  She describes having bowel movements once every 2 or 3 weeks.  She was also having some rectal bleeding felt secondary to hemorrhoids for which her primary care provider, Dr. Osborne Kennedy, referred her to Dr. Dema Kennedy.  She was seen by Dr. Dema Kennedy May 22, 2022.  At that time she underwent anoscopy which revealed small internal hemorrhoids.  No additional significant abnormalities.  She was told to take MiraLAX daily and referred to gastroenterology.   Patient tells me that she has been taking 1 dose of MiraLAX daily.  Despite that she continues with infrequent bowel movements.  This does make her abdomen somewhat uncomfortable.  She continues to see moderate blood at times with defecation.  She also reports at least 20 pound weight loss over the past year.  Associated with this has been a decreased appetite.  Review of blood work from September 2023 shows unremarkable comprehensive metabolic panel.  Normal CBC with hemoglobin 13.5.  CT scan and MRI of the abdomen to evaluate microscopic hematuria was performed in 2018.  No significant abdominal abnormalities.   REVIEW OF SYSTEMS:   All non-GI ROS negative unless otherwise stated in the HPI.     Past Medical History:  Diagnosis Date   Angina pectoris     Arthritis             Past Surgical History:  Procedure Laterality Date   CARDIAC CATHETERIZATION   2008    normal   HEMORRHOID SURGERY   2000   HERNIA REPAIR   2000     Inquinal Bilateral   TUBAL LIGATION   1977      Social History Ebony Kennedy  reports that she has never smoked. She has never used smokeless tobacco. She reports current alcohol use. She reports that she does not use drugs.   family history is not on file.   No Known Allergies       PHYSICAL EXAMINATION: Vital signs: BP 128/68   Pulse 70   Ht 5' (1.524 m)   Wt 137 lb (62.1 kg)   BMI 26.76 kg/m   Constitutional: generally well-appearing, no acute distress Psychiatric: alert and oriented x3, cooperative Eyes: extraocular movements intact, anicteric, conjunctiva pink Mouth: oral pharynx moist, no lesions Neck: supple no lymphadenopathy Cardiovascular: heart regular rate and rhythm, no murmur Lungs: clear to auscultation bilaterally Abdomen: soft, nontender, nondistended, no obvious ascites, no peritoneal signs, normal bowel sounds, no organomegaly Rectal: Deferred to colonoscopy (evaluation with Dr. Dema Kennedy June 2023 noted) Extremities: no clubbing, cyanosis, or lower extremity edema bilaterally Skin: no lesions on visible extremities Neuro: No focal deficits.  Cranial nerves intact   ASSESSMENT:   1.  Change in bowel habits with progressive constipation and weight loss.  No rectal bleeding.  Negative recent anoscopy save small hemorrhoids. 2.  Unremarkable colonoscopy 2006 and 2013     PLAN:   1.  Increase MiraLAX to 2 doses daily for 1 week.  If ineffective, increase to 3 doses  daily 2.  Schedule colonoscopy to evaluate change in bowel habits, progressive constipation, minor rectal bleeding, and weight loss.The nature of the procedure, as well as the risks, benefits, and alternatives were carefully and thoroughly reviewed with the patient. Ample time for discussion and questions allowed. The patient understood, was satisfied, and agreed to proceed.

## 2022-09-18 NOTE — Progress Notes (Signed)
Called to room to assist during endoscopic procedure.  Patient ID and intended procedure confirmed with present staff. Received instructions for my participation in the procedure from the performing physician.  

## 2022-09-18 NOTE — Op Note (Signed)
[facility] Patient Name: Ebony Kennedy Procedure Date: 09/18/2022 2:08 PM MRN: [id number] Endoscopist: Docia Chuck. Henrene Pastor , MD Age: 81 Referring MD:  Date of Birth: November 20, 1941 Gender: Female Account #: [id number] Procedure:                Colonoscopy with cold snare polypectomy x1 Indications:              Rectal bleeding, Change in bowel habits, weight                            loss, constipation. Previous examinations 2006, 2013 Medicines:                Monitored Anesthesia Care Procedure:                Pre-Anesthesia Assessment:                           - Prior to the procedure, a History and Physical                            was performed, and patient medications and                            allergies were reviewed. The patient's tolerance of                            previous anesthesia was also reviewed. The risks                            and benefits of the procedure and the sedation                            options and risks were discussed with the patient.                            All questions were answered, and informed consent                            was obtained. Prior Anticoagulants: The patient has                            taken no previous anticoagulant or antiplatelet                            agents. ASA Grade Assessment: II - A patient with                            mild systemic disease. After reviewing the risks                            and benefits, the patient was deemed in                            satisfactory condition to undergo the procedure.  After obtaining informed consent, the colonoscope                            was passed under direct vision. Throughout the                            procedure, the patient's blood pressure, pulse, and                            oxygen saturations were monitored continuously. The                            Colonoscope was introduced through the anus and                             advanced to the the cecum, identified by                            appendiceal orifice and ileocecal valve. The                            ileocecal valve, appendiceal orifice, and rectum                            were photographed. The quality of the bowel                            preparation was excellent. The colonoscopy was                            performed without difficulty. The patient tolerated                            the procedure well. The bowel preparation used was                            SUPREP via split dose instruction. Scope In: 2:17:05 PM Scope Out: 2:33:46 PM Scope Withdrawal Time: 0 hours 11 minutes 44 seconds  Total Procedure Duration: 0 hours 16 minutes 41 seconds  Findings:                 A 1 mm polyp was found in the cecum. The polyp was                            removed with a cold snare. Resection and retrieval                            were complete.                           A few small-mouthed diverticula were found in the                            sigmoid colon.  The exam was otherwise without abnormality on                            direct and retroflexion views. Complications:            No immediate complications. Estimated blood loss:                            None. Estimated Blood Loss:     Estimated blood loss: none. Impression:               - One 1 mm polyp in the cecum, removed with a cold                            snare. Resected and retrieved.                           - Diverticulosis in the sigmoid colon.                           - The examination was otherwise normal on direct                            and retroflexion views. Recommendation:           - Repeat colonoscopy is not recommended for                            surveillance.                           - Patient has a contact number available for                            emergencies. The signs and symptoms of  potential                            delayed complications were discussed with the                            patient. Return to normal activities tomorrow.                            Written discharge instructions were provided to the                            patient.                           - Resume previous diet.                           - Continue present medications.                           - Await pathology results.                           -  Please schedule contrast-enhanced CT scan of the                            abdomen and pelvis "unexplained weight loss".                           If negative, she will return to the care of her PCP Docia Chuck. Henrene Pastor, MD 09/18/2022 2:43:08 PM This report has been signed electronically.

## 2022-09-18 NOTE — Progress Notes (Signed)
Pt's states no medical or surgical changes since previsit or office visit. 

## 2022-09-19 ENCOUNTER — Telehealth: Payer: Self-pay

## 2022-09-19 ENCOUNTER — Other Ambulatory Visit: Payer: Self-pay

## 2022-09-19 DIAGNOSIS — R634 Abnormal weight loss: Secondary | ICD-10-CM

## 2022-09-19 NOTE — Telephone Encounter (Signed)
Order placed in epic for CT of a/p for wt loss. Rad scheduling should contact pt regarding the appt.

## 2022-09-19 NOTE — Telephone Encounter (Signed)
  Follow up Call-     09/18/2022    1:19 PM  Call back number  Post procedure Call Back phone  # [phone]  Permission to leave phone message Yes     Patient questions:  Do you have a fever, pain , or abdominal swelling? No. Pain Score  0 *  Have you tolerated food without any problems? Yes.    Have you been able to return to your normal activities? Yes.    Do you have any questions about your discharge instructions: Diet   No. Medications  No. Follow up visit  No.  Do you have questions or concerns about your Care? No.  Actions: * If pain score is 4 or above: No action needed, pain <4.

## 2022-09-23 ENCOUNTER — Encounter: Payer: Self-pay | Admitting: Internal Medicine

## 2022-09-30 ENCOUNTER — Ambulatory Visit (HOSPITAL_COMMUNITY)
Admission: RE | Admit: 2022-09-30 | Discharge: 2022-09-30 | Disposition: A | Discharge status: Home / Self Care | Payer: PPO | Source: Ambulatory Visit | Attending: Internal Medicine | Admitting: Internal Medicine

## 2022-09-30 DIAGNOSIS — R634 Abnormal weight loss: Secondary | ICD-10-CM | POA: Insufficient documentation

## 2022-09-30 DIAGNOSIS — R1012 Left upper quadrant pain: Secondary | ICD-10-CM | POA: Diagnosis not present

## 2022-09-30 DIAGNOSIS — I700 Atherosclerosis of aorta: Secondary | ICD-10-CM | POA: Diagnosis not present

## 2022-09-30 MED ORDER — SODIUM CHLORIDE (PF) 0.9 % IJ SOLN
INTRAMUSCULAR | Status: AC
  Filled 2022-09-30: qty 50

## 2022-09-30 MED ORDER — IOHEXOL 300 MG/ML  SOLN
100.0000 mL | Freq: Once | INTRAMUSCULAR | Status: AC | PRN
  Administered 2022-09-30: 100 mL via INTRAVENOUS

## 2022-11-14 DIAGNOSIS — H402212 Chronic angle-closure glaucoma, right eye, moderate stage: Secondary | ICD-10-CM | POA: Diagnosis not present

## 2022-11-14 DIAGNOSIS — H04123 Dry eye syndrome of bilateral lacrimal glands: Secondary | ICD-10-CM | POA: Diagnosis not present

## 2022-11-14 DIAGNOSIS — H402221 Chronic angle-closure glaucoma, left eye, mild stage: Secondary | ICD-10-CM | POA: Diagnosis not present

## 2022-11-14 DIAGNOSIS — H524 Presbyopia: Secondary | ICD-10-CM | POA: Diagnosis not present

## 2022-11-14 DIAGNOSIS — H16223 Keratoconjunctivitis sicca, not specified as Sjogren's, bilateral: Secondary | ICD-10-CM | POA: Diagnosis not present

## 2022-12-11 DIAGNOSIS — H1132 Conjunctival hemorrhage, left eye: Secondary | ICD-10-CM | POA: Diagnosis not present

## 2023-03-11 DIAGNOSIS — E559 Vitamin D deficiency, unspecified: Secondary | ICD-10-CM | POA: Diagnosis not present

## 2023-03-11 DIAGNOSIS — M810 Age-related osteoporosis without current pathological fracture: Secondary | ICD-10-CM | POA: Diagnosis not present

## 2023-03-11 DIAGNOSIS — I10 Essential (primary) hypertension: Secondary | ICD-10-CM | POA: Diagnosis not present

## 2023-03-11 DIAGNOSIS — R7989 Other specified abnormal findings of blood chemistry: Secondary | ICD-10-CM | POA: Diagnosis not present

## 2023-03-11 DIAGNOSIS — E7800 Pure hypercholesterolemia, unspecified: Secondary | ICD-10-CM | POA: Diagnosis not present

## 2023-03-13 DIAGNOSIS — M19012 Primary osteoarthritis, left shoulder: Secondary | ICD-10-CM | POA: Diagnosis not present

## 2023-03-13 DIAGNOSIS — Z0000 Encounter for general adult medical examination without abnormal findings: Secondary | ICD-10-CM | POA: Diagnosis not present

## 2023-03-13 DIAGNOSIS — E7800 Pure hypercholesterolemia, unspecified: Secondary | ICD-10-CM | POA: Diagnosis not present

## 2023-03-13 DIAGNOSIS — M79602 Pain in left arm: Secondary | ICD-10-CM | POA: Diagnosis not present

## 2023-03-13 DIAGNOSIS — M545 Low back pain, unspecified: Secondary | ICD-10-CM | POA: Diagnosis not present

## 2023-03-13 DIAGNOSIS — E663 Overweight: Secondary | ICD-10-CM | POA: Diagnosis not present

## 2023-03-13 DIAGNOSIS — D692 Other nonthrombocytopenic purpura: Secondary | ICD-10-CM | POA: Diagnosis not present

## 2023-03-13 DIAGNOSIS — Z1339 Encounter for screening examination for other mental health and behavioral disorders: Secondary | ICD-10-CM | POA: Diagnosis not present

## 2023-03-13 DIAGNOSIS — K649 Unspecified hemorrhoids: Secondary | ICD-10-CM | POA: Diagnosis not present

## 2023-03-13 DIAGNOSIS — M810 Age-related osteoporosis without current pathological fracture: Secondary | ICD-10-CM | POA: Diagnosis not present

## 2023-03-13 DIAGNOSIS — I10 Essential (primary) hypertension: Secondary | ICD-10-CM | POA: Diagnosis not present

## 2023-03-13 DIAGNOSIS — N39490 Overflow incontinence: Secondary | ICD-10-CM | POA: Diagnosis not present

## 2023-03-13 DIAGNOSIS — R82998 Other abnormal findings in urine: Secondary | ICD-10-CM | POA: Diagnosis not present

## 2023-03-13 DIAGNOSIS — Z1331 Encounter for screening for depression: Secondary | ICD-10-CM | POA: Diagnosis not present

## 2023-05-20 DIAGNOSIS — H402212 Chronic angle-closure glaucoma, right eye, moderate stage: Secondary | ICD-10-CM | POA: Diagnosis not present

## 2023-05-20 DIAGNOSIS — H402221 Chronic angle-closure glaucoma, left eye, mild stage: Secondary | ICD-10-CM | POA: Diagnosis not present

## 2023-06-02 ENCOUNTER — Encounter: Payer: Self-pay | Admitting: Podiatry

## 2023-06-02 ENCOUNTER — Ambulatory Visit: Payer: PPO | Admitting: Podiatry

## 2023-06-02 ENCOUNTER — Ambulatory Visit (INDEPENDENT_AMBULATORY_CARE_PROVIDER_SITE_OTHER): Payer: PPO

## 2023-06-02 DIAGNOSIS — M722 Plantar fascial fibromatosis: Secondary | ICD-10-CM

## 2023-06-02 DIAGNOSIS — R52 Pain, unspecified: Secondary | ICD-10-CM | POA: Diagnosis not present

## 2023-06-02 MED ORDER — TRIAMCINOLONE ACETONIDE 10 MG/ML IJ SUSP
10.0000 mg | Freq: Once | INTRAMUSCULAR | Status: AC
  Administered 2023-06-02: 10 mg

## 2023-06-03 ENCOUNTER — Other Ambulatory Visit: Payer: Self-pay | Admitting: Podiatry

## 2023-06-03 DIAGNOSIS — R52 Pain, unspecified: Secondary | ICD-10-CM

## 2023-06-03 DIAGNOSIS — M722 Plantar fascial fibromatosis: Secondary | ICD-10-CM

## 2023-06-03 NOTE — Progress Notes (Signed)
Subjective:   Patient ID: Ebony Kennedy, female   DOB: 82 y.o.   MRN: [id number]   HPI Patient states she is getting a lot of pain in her right plantar heel 3 weeks duration.  States it has been very sore and hard to walk on.  Does not remember injury does not smoke likes to be active   Review of Systems  All other systems reviewed and are negative.       Objective:  Physical Exam Vitals and nursing note reviewed.  Constitutional:      Appearance: She is well-developed.  Pulmonary:     Effort: Pulmonary effort is normal.  Musculoskeletal:        General: Normal range of motion.  Skin:    General: Skin is warm.  Neurological:     Mental Status: She is alert.     Neurovascular status intact muscle strength found to be adequate range of motion adequate exquisite discomfort medial fascial band right at the insertional point tendon calcaneus fluid buildup around the medial band     Assessment:  Acute Planter  Fasciitis right with inflammation fluid of the medial band     Plan:  H&P reviewed condition and went ahead today did sterile prep and injected the plantar fascia right 3 mg Kenalog 5 mg Xylocaine and instructed on supportive shoes.  Reappoint to recheck and Saleema require more aggressive treatment  X-rays indicate small spur no indication stress fracture arthritis

## 2023-11-11 DIAGNOSIS — R058 Other specified cough: Secondary | ICD-10-CM | POA: Diagnosis not present

## 2023-11-11 DIAGNOSIS — R0602 Shortness of breath: Secondary | ICD-10-CM | POA: Diagnosis not present

## 2023-11-11 DIAGNOSIS — U071 COVID-19: Secondary | ICD-10-CM | POA: Diagnosis not present

## 2023-11-11 DIAGNOSIS — J4520 Mild intermittent asthma, uncomplicated: Secondary | ICD-10-CM | POA: Diagnosis not present

## 2023-11-11 DIAGNOSIS — H8110 Benign paroxysmal vertigo, unspecified ear: Secondary | ICD-10-CM | POA: Diagnosis not present

## 2024-02-05 DIAGNOSIS — H16223 Keratoconjunctivitis sicca, not specified as Sjogren's, bilateral: Secondary | ICD-10-CM | POA: Diagnosis not present

## 2024-02-05 DIAGNOSIS — H04123 Dry eye syndrome of bilateral lacrimal glands: Secondary | ICD-10-CM | POA: Diagnosis not present

## 2024-02-05 DIAGNOSIS — H402212 Chronic angle-closure glaucoma, right eye, moderate stage: Secondary | ICD-10-CM | POA: Diagnosis not present

## 2024-02-05 DIAGNOSIS — H402221 Chronic angle-closure glaucoma, left eye, mild stage: Secondary | ICD-10-CM | POA: Diagnosis not present

## 2024-02-05 DIAGNOSIS — H524 Presbyopia: Secondary | ICD-10-CM | POA: Diagnosis not present

## 2024-03-16 DIAGNOSIS — E559 Vitamin D deficiency, unspecified: Secondary | ICD-10-CM | POA: Diagnosis not present

## 2024-03-16 DIAGNOSIS — I10 Essential (primary) hypertension: Secondary | ICD-10-CM | POA: Diagnosis not present

## 2024-03-16 DIAGNOSIS — M810 Age-related osteoporosis without current pathological fracture: Secondary | ICD-10-CM | POA: Diagnosis not present

## 2024-03-16 DIAGNOSIS — E785 Hyperlipidemia, unspecified: Secondary | ICD-10-CM | POA: Diagnosis not present

## 2024-03-16 DIAGNOSIS — E7800 Pure hypercholesterolemia, unspecified: Secondary | ICD-10-CM | POA: Diagnosis not present

## 2024-03-23 DIAGNOSIS — M545 Low back pain, unspecified: Secondary | ICD-10-CM | POA: Diagnosis not present

## 2024-03-23 DIAGNOSIS — M19012 Primary osteoarthritis, left shoulder: Secondary | ICD-10-CM | POA: Diagnosis not present

## 2024-03-23 DIAGNOSIS — M810 Age-related osteoporosis without current pathological fracture: Secondary | ICD-10-CM | POA: Diagnosis not present

## 2024-03-23 DIAGNOSIS — N39490 Overflow incontinence: Secondary | ICD-10-CM | POA: Diagnosis not present

## 2024-03-23 DIAGNOSIS — E7800 Pure hypercholesterolemia, unspecified: Secondary | ICD-10-CM | POA: Diagnosis not present

## 2024-03-23 DIAGNOSIS — Z0000 Encounter for general adult medical examination without abnormal findings: Secondary | ICD-10-CM | POA: Diagnosis not present

## 2024-03-23 DIAGNOSIS — K649 Unspecified hemorrhoids: Secondary | ICD-10-CM | POA: Diagnosis not present

## 2024-03-23 DIAGNOSIS — H4020X0 Unspecified primary angle-closure glaucoma, stage unspecified: Secondary | ICD-10-CM | POA: Diagnosis not present

## 2024-03-23 DIAGNOSIS — Z1331 Encounter for screening for depression: Secondary | ICD-10-CM | POA: Diagnosis not present

## 2024-03-23 DIAGNOSIS — R82998 Other abnormal findings in urine: Secondary | ICD-10-CM | POA: Diagnosis not present

## 2024-03-23 DIAGNOSIS — D692 Other nonthrombocytopenic purpura: Secondary | ICD-10-CM | POA: Diagnosis not present

## 2024-03-23 DIAGNOSIS — Z1339 Encounter for screening examination for other mental health and behavioral disorders: Secondary | ICD-10-CM | POA: Diagnosis not present

## 2024-03-23 DIAGNOSIS — E663 Overweight: Secondary | ICD-10-CM | POA: Diagnosis not present

## 2024-08-17 DIAGNOSIS — S0990XA Unspecified injury of head, initial encounter: Secondary | ICD-10-CM | POA: Diagnosis not present

## 2024-08-17 DIAGNOSIS — S01112A Laceration without foreign body of left eyelid and periocular area, initial encounter: Secondary | ICD-10-CM | POA: Diagnosis not present

## 2024-08-17 DIAGNOSIS — W06XXXA Fall from bed, initial encounter: Secondary | ICD-10-CM | POA: Diagnosis not present

## 2024-08-17 DIAGNOSIS — Z23 Encounter for immunization: Secondary | ICD-10-CM | POA: Diagnosis not present

## 2024-08-24 DIAGNOSIS — H402221 Chronic angle-closure glaucoma, left eye, mild stage: Secondary | ICD-10-CM | POA: Diagnosis not present

## 2024-08-24 DIAGNOSIS — H402212 Chronic angle-closure glaucoma, right eye, moderate stage: Secondary | ICD-10-CM | POA: Diagnosis not present
